# Patient Record
Sex: Male | Born: 1985 | Race: Black or African American | Hispanic: No | Marital: Single | Smoking: Former smoker
Health system: Southern US, Community
[De-identification: ages and names within clinical notes are randomized; demographics above are authoritative.]

## PROBLEM LIST (undated history)

## (undated) DIAGNOSIS — S4990XA Unspecified injury of shoulder and upper arm, unspecified arm, initial encounter: Secondary | ICD-10-CM

## (undated) DIAGNOSIS — A749 Chlamydial infection, unspecified: Secondary | ICD-10-CM

---

## 2009-03-21 ENCOUNTER — Emergency Department (HOSPITAL_COMMUNITY): Admission: EM | Admit: 2009-03-21 | Discharge: 2009-03-21 | Payer: Self-pay | Admitting: Emergency Medicine

## 2010-09-11 LAB — URINALYSIS, ROUTINE W REFLEX MICROSCOPIC
Hgb urine dipstick: NEGATIVE
Protein, ur: NEGATIVE mg/dL
Urobilinogen, UA: 1 mg/dL (ref 0.0–1.0)

## 2010-09-11 LAB — GC/CHLAMYDIA PROBE AMP, GENITAL: Chlamydia, DNA Probe: POSITIVE — AB

## 2010-09-11 LAB — URINE MICROSCOPIC-ADD ON

## 2010-09-11 LAB — RPR: RPR Ser Ql: NONREACTIVE

## 2011-07-28 ENCOUNTER — Emergency Department (HOSPITAL_COMMUNITY)
Admission: EM | Admit: 2011-07-28 | Discharge: 2011-07-28 | Disposition: A | Discharge status: Home Health | Payer: Self-pay | Attending: Emergency Medicine | Admitting: Emergency Medicine

## 2011-07-28 ENCOUNTER — Encounter (HOSPITAL_COMMUNITY): Payer: Self-pay

## 2011-07-28 DIAGNOSIS — R369 Urethral discharge, unspecified: Secondary | ICD-10-CM | POA: Insufficient documentation

## 2011-07-28 DIAGNOSIS — A64 Unspecified sexually transmitted disease: Secondary | ICD-10-CM | POA: Insufficient documentation

## 2011-07-28 DIAGNOSIS — L293 Anogenital pruritus, unspecified: Secondary | ICD-10-CM | POA: Insufficient documentation

## 2011-07-28 DIAGNOSIS — N509 Disorder of male genital organs, unspecified: Secondary | ICD-10-CM | POA: Insufficient documentation

## 2011-07-28 DIAGNOSIS — R300 Dysuria: Secondary | ICD-10-CM | POA: Insufficient documentation

## 2011-07-28 LAB — URINALYSIS, ROUTINE W REFLEX MICROSCOPIC
Hgb urine dipstick: NEGATIVE
Leukocytes, UA: NEGATIVE
Nitrite: NEGATIVE
Specific Gravity, Urine: >1.03 — ABNORMAL HIGH (ref 1.005–1.030)
Urobilinogen, UA: 0.2 mg/dL (ref 0.0–1.0)

## 2011-07-28 MED ORDER — DOXYCYCLINE HYCLATE 100 MG PO CAPS: 100.0000 mg | ORAL_CAPSULE | Freq: Two times a day (BID) | ORAL | Status: AC

## 2011-07-28 MED ORDER — AZITHROMYCIN 250 MG PO TABS
1000.0000 mg | ORAL_TABLET | Freq: Once | ORAL | Status: AC
  Administered 2011-07-28: 1000 mg via ORAL
  Filled 2011-07-28: qty 4

## 2011-07-28 MED ORDER — CEFTRIAXONE SODIUM 250 MG IJ SOLR
250.0000 mg | Freq: Once | INTRAMUSCULAR | Status: AC
  Administered 2011-07-28: 250 mg via INTRAMUSCULAR
  Filled 2011-07-28: qty 250

## 2011-07-28 NOTE — ED Provider Notes (Signed)
History     CSN: [id number]  Arrival date & time 07/28/11  1117   First MD Initiated Contact with Patient 07/28/11 1231      Chief Complaint  Patient presents with  . Penile Discharge    (Consider location/radiation/quality/duration/timing/severity/associated sxs/prior treatment) Patient is a 26 y.o. male presenting with penile discharge. The history is provided by the patient. No language interpreter was used.  Penile Discharge This is a new problem. The current episode started in the past 7 days. The problem occurs 2 to 4 times per day. The problem has been gradually worsening. Associated symptoms include urinary symptoms. Pertinent negatives include no abdominal pain, fever, nausea, rash, sore throat, swollen glands or vomiting. Exacerbated by: urinating.   Reports penile discharge and burning with urination which itch x7 days. States that he had testicular pain a month ago but that has resolved. States that he was exposed to chlamydia. Patient has more than one partner.  History reviewed. No pertinent past medical history.  History reviewed. No pertinent past surgical history.  No family history on file.  History  Substance Use Topics  . Smoking status: Never Smoker   . Smokeless tobacco: Not on file  . Alcohol Use: No      Review of Systems  Constitutional: Negative for fever.  HENT: Negative for sore throat.   Gastrointestinal: Negative for nausea, vomiting and abdominal pain.  Genitourinary: Positive for dysuria and discharge. Negative for urgency, frequency, hematuria, flank pain, decreased urine volume, penile swelling, scrotal swelling, difficulty urinating, genital sores, penile pain and testicular pain.  Musculoskeletal: Negative for back pain.  Skin: Negative for rash.  All other systems reviewed and are negative.    Allergies  Review of patient's allergies indicates no known allergies.  Home Medications   Current Outpatient Rx  Name Route Sig Dispense  Refill  . DOXYCYCLINE HYCLATE 100 MG PO CAPS Oral Take 1 capsule (100 mg total) by mouth 2 (two) times daily. 14 capsule 0    BP 130/86  Pulse 64  Temp(Src) 98.1 F (36.7 C) (Oral)  Resp 12  Ht 5\' 11"  (1.803 m)  Wt 170 lb (77.111 kg)  BMI 23.71 kg/m2  SpO2 99%  Physical Exam  Nursing note and vitals reviewed. Constitutional: He is oriented to person, place, and time. He appears well-developed and well-nourished.  HENT:  Head: Normocephalic and atraumatic.  Eyes: Pupils are equal, round, and reactive to light.  Neck: Neck supple.  Cardiovascular: Normal rate and regular rhythm.  Exam reveals no gallop and no friction rub.   No murmur heard. Pulmonary/Chest: Breath sounds normal. No respiratory distress.  Abdominal: Soft. He exhibits no distension. Hernia confirmed negative in the right inguinal area and confirmed negative in the left inguinal area.  Genitourinary: Right testis shows no mass, no swelling and no tenderness. Left testis shows no mass, no swelling and no tenderness. No penile erythema or penile tenderness. No discharge found.  Musculoskeletal: Normal range of motion.  Neurological: He is alert and oriented to person, place, and time. No cranial nerve deficit.  Skin: Skin is warm and dry.  Psychiatric: He has a normal mood and affect.    ED Course  Procedures (including critical care time)   Labs Reviewed  URINALYSIS, ROUTINE W REFLEX MICROSCOPIC  WET PREP, GENITAL   No results found.   1. STD (male)       MDM  Penile drainage x 7 days.  Unprotected sex with 2 partners. Rocephen 250mg  IM and 1  gm azithromycin po in the ER. RX for doxycycline.   No Discharge on exam. Will followup in 7 days [facility]        Jethro Bastos, NP 07/29/11 1718

## 2011-07-28 NOTE — ED Notes (Signed)
Penile drainage and itchy for 7 days, also having dysuria

## 2011-07-28 NOTE — Discharge Instructions (Signed)
Mr Siemon followup at the [facility] located on [facility] .[phone]

## 2011-07-29 LAB — GC/CHLAMYDIA PROBE AMP, GENITAL
Chlamydia, DNA Probe: NEGATIVE
GC Probe Amp, Genital: NEGATIVE

## 2011-07-31 NOTE — ED Provider Notes (Signed)
Medical screening examination/treatment/procedure(s) were performed by non-physician practitioner and as supervising physician I was immediately available for consultation/collaboration.  Doug Sou, MD 07/31/11 [phone]

## 2012-07-16 ENCOUNTER — Encounter (HOSPITAL_COMMUNITY): Payer: Self-pay | Admitting: Emergency Medicine

## 2012-07-16 ENCOUNTER — Emergency Department (HOSPITAL_COMMUNITY)
Admission: EM | Admit: 2012-07-16 | Discharge: 2012-07-16 | Disposition: A | Discharge status: Home / Self Care | Payer: Self-pay | Attending: Emergency Medicine | Admitting: Emergency Medicine

## 2012-07-16 DIAGNOSIS — Z8619 Personal history of other infectious and parasitic diseases: Secondary | ICD-10-CM | POA: Insufficient documentation

## 2012-07-16 DIAGNOSIS — Z7251 High risk heterosexual behavior: Secondary | ICD-10-CM | POA: Insufficient documentation

## 2012-07-16 DIAGNOSIS — N342 Other urethritis: Secondary | ICD-10-CM | POA: Insufficient documentation

## 2012-07-16 HISTORY — DX: Chlamydial infection, unspecified: A74.9

## 2012-07-16 LAB — URINALYSIS, ROUTINE W REFLEX MICROSCOPIC
Bilirubin Urine: NEGATIVE
Glucose, UA: NEGATIVE mg/dL
Hgb urine dipstick: NEGATIVE
Ketones, ur: NEGATIVE mg/dL
Protein, ur: NEGATIVE mg/dL

## 2012-07-16 MED ORDER — CEFTRIAXONE SODIUM 250 MG IJ SOLR
250.0000 mg | INTRAMUSCULAR | Status: DC
  Administered 2012-07-16: 250 mg via INTRAMUSCULAR
  Filled 2012-07-16: qty 250

## 2012-07-16 MED ORDER — AZITHROMYCIN 1 G PO PACK
1.0000 g | PACK | Freq: Once | ORAL | Status: AC
  Administered 2012-07-16: 1 g via ORAL
  Filled 2012-07-16: qty 1

## 2012-07-16 NOTE — ED Notes (Signed)
Pt c/o itching and discomfort around penis area x 1 week. Pt denies discharge.

## 2012-07-16 NOTE — ED Provider Notes (Signed)
History  This chart was scribed for non-physician practitioner, Wynetta Emery, PA-C working with Loren Racer, MD by Shari Heritage, ED Scribe. This patient was seen in room TR09C/TR09C and the patient's care was started at 1646.   CSN: [id number]  Arrival date & time 07/16/12  1522   First MD Initiated Contact with Patient 07/16/12 1646      Chief Complaint  Patient presents with  . Groin Pain    The history is provided by the patient. No language interpreter was used.    HPI Comments: Kenneth Tanner is a 27 y.o. male who presents to the Emergency Department complaining of mild, intermittent, non-radiating penile pain with associated itching and dysuria onset 1 week ago. Patient denies penile discharge, rash, abdominal pain or fever. He states that he has had unprotected sex recently. He denies any other complaints at this time. He has a history of chlamydia. Patient denies any allergies to medicine.  Past Medical History  Diagnosis Date  . Chlamydia     History reviewed. No pertinent past surgical history.  No family history on file.  History  Substance Use Topics  . Smoking status: Never Smoker   . Smokeless tobacco: Not on file  . Alcohol Use: No      Review of Systems  Constitutional: Negative for fever.  Gastrointestinal: Negative for nausea, vomiting, abdominal pain and diarrhea.  Genitourinary: Positive for dysuria and penile pain. Negative for discharge.  All other systems reviewed and are negative.    Allergies  Review of patient's allergies indicates no known allergies.  Home Medications  No current outpatient prescriptions on file.  Triage Vitals: BP 135/74  Pulse 72  Temp(Src) 98.2 F (36.8 C) (Oral)  Resp 20  SpO2 99%  Physical Exam  Nursing note and vitals reviewed. Constitutional: He is oriented to person, place, and time. He appears well-developed and well-nourished. No distress.  HENT:  Head: Normocephalic.  Eyes: Conjunctivae and  EOM are normal. Pupils are equal, round, and reactive to light.  Cardiovascular: Normal rate.   Pulmonary/Chest: Effort normal and breath sounds normal. No stridor.  Abdominal: Bowel sounds are normal.  Genitourinary: Penis normal. Right testis shows no mass, no swelling and no tenderness. Right testis is descended. Left testis shows no mass, no swelling and no tenderness. Left testis is descended. No penile tenderness. No discharge found.  GU exam chaperoned by technician  Musculoskeletal: Normal range of motion.  Neurological: He is alert and oriented to person, place, and time.  Psychiatric: He has a normal mood and affect.    ED Course  Procedures (including critical care time) DIAGNOSTIC STUDIES: Oxygen Saturation is 99% on room air, normal by my interpretation.    COORDINATION OF CARE: 6:09 PM- Patient informed of current plan for treatment and evaluation and agrees with plan at this time.    Labs Reviewed  GC/CHLAMYDIA PROBE AMP  URINALYSIS, ROUTINE W REFLEX MICROSCOPIC   Medications  azithromycin (ZITHROMAX) powder 1 g (1 g Oral Given 07/16/12 1921)    No results found.   1. Urethritis       MDM   Patient with urethritis GU exam is normal. I have advised prophylactic treatment today before GC Chlamydia probe is finalized. Patient understands he has not been diagnosed with GC Chlamydia and has opted for treatment today. I also advised a full STD screen. Patient is advised to refrain from sex until he has the results of all STD tests.   Filed Vitals:   07/16/12 1547  BP: 135/74  Pulse: 72  Temp: 98.2 F (36.8 C)  TempSrc: Oral  Resp: 20  SpO2: 99%     Pt verbalized understanding and agrees with care plan. Outpatient follow-up and return precautions given.    I personally performed the services described in this documentation, which was scribed in my presence. The recorded information has been reviewed and is accurate.    Wynetta Emery, PA-C 07/17/12  1710

## 2012-07-18 NOTE — ED Provider Notes (Signed)
Medical screening examination/treatment/procedure(s) were performed by non-physician practitioner and as supervising physician I was immediately available for consultation/collaboration.   Loren Racer, MD 07/18/12 1538

## 2013-08-04 ENCOUNTER — Emergency Department (HOSPITAL_COMMUNITY)
Admission: EM | Admit: 2013-08-04 | Discharge: 2013-08-04 | Disposition: A | Discharge status: Home / Self Care | Payer: Self-pay | Attending: Emergency Medicine | Admitting: Emergency Medicine

## 2013-08-04 ENCOUNTER — Emergency Department (HOSPITAL_COMMUNITY): Payer: Self-pay

## 2013-08-04 ENCOUNTER — Encounter (HOSPITAL_COMMUNITY): Payer: Self-pay | Admitting: Emergency Medicine

## 2013-08-04 DIAGNOSIS — S43401A Unspecified sprain of right shoulder joint, initial encounter: Secondary | ICD-10-CM

## 2013-08-04 DIAGNOSIS — Y9323 Activity, snow (alpine) (downhill) skiing, snow boarding, sledding, tobogganing and snow tubing: Secondary | ICD-10-CM | POA: Insufficient documentation

## 2013-08-04 DIAGNOSIS — Y929 Unspecified place or not applicable: Secondary | ICD-10-CM | POA: Insufficient documentation

## 2013-08-04 DIAGNOSIS — Z8619 Personal history of other infectious and parasitic diseases: Secondary | ICD-10-CM | POA: Insufficient documentation

## 2013-08-04 DIAGNOSIS — IMO0002 Reserved for concepts with insufficient information to code with codable children: Secondary | ICD-10-CM | POA: Insufficient documentation

## 2013-08-04 MED ORDER — HYDROCODONE-ACETAMINOPHEN 5-325 MG PO TABS
1.0000 | ORAL_TABLET | Freq: Once | ORAL | Status: AC
  Administered 2013-08-04: 1 via ORAL
  Filled 2013-08-04: qty 1

## 2013-08-04 MED ORDER — HYDROCODONE-ACETAMINOPHEN 5-325 MG PO TABS: 1.0000 | ORAL_TABLET | ORAL | Status: DC | PRN

## 2013-08-04 MED ORDER — IBUPROFEN 800 MG PO TABS: 800.0000 mg | ORAL_TABLET | Freq: Three times a day (TID) | ORAL | Status: DC

## 2013-08-04 NOTE — ED Provider Notes (Signed)
CSN: [id number]     Arrival date & time 08/04/13  0058 History   First MD Initiated Contact with Patient 08/04/13 0119     Chief Complaint  Patient presents with  . Shoulder Pain     (Consider location/radiation/quality/duration/timing/severity/associated sxs/prior Treatment) HPI History provided by pt.   Pt was snowboarding for the first time today, fell off and landed on back on the snow.  Did not hit his head and denies neck/back pain.  C/o pain in right shoulder that is non-radiating and aggravated by ROM and palpation only.  No associated paresthesias or weakness of RUE. Has not taken anything for pain. No prior injury to this shoulder.  Past Medical History  Diagnosis Date  . Chlamydia    History reviewed. No pertinent past surgical history. History reviewed. No pertinent family history. History  Substance Use Topics  . Smoking status: Never Smoker   . Smokeless tobacco: Not on file  . Alcohol Use: No    Review of Systems  All other systems reviewed and are negative.      Allergies  Review of patient's allergies indicates no known allergies.  Home Medications   Current Outpatient Rx  Name  Route  Sig  Dispense  Refill  . HYDROcodone-acetaminophen (NORCO/VICODIN) 5-325 MG per tablet   Oral   Take 1 tablet by mouth every 4 (four) hours as needed for moderate pain.   15 tablet   0   . ibuprofen (ADVIL,MOTRIN) 800 MG tablet   Oral   Take 1 tablet (800 mg total) by mouth 3 (three) times daily.   12 tablet   0    BP 125/75  Pulse 78  Temp(Src) 98 F (36.7 C) (Oral)  Resp 17  SpO2 97% Physical Exam  Nursing note and vitals reviewed. Constitutional: He is oriented to person, place, and time. He appears well-developed and well-nourished. No distress.  HENT:  Head: Normocephalic and atraumatic.  Eyes:  Normal appearance  Neck: Normal range of motion.  Pulmonary/Chest: Effort normal.  Musculoskeletal: Normal range of motion.  Entire spine non-tender.  R  shoulder w/out deformity, ecchymosis or abrasion.  Tenderness isolated to just inferior to lateral scapular spine.  Pain w/ passive flexion and abduction >45 deg.  Nml elbow and wrist.  2+ radial pulse and distal sensation intact.      Neurological: He is alert and oriented to person, place, and time.  Psychiatric: He has a normal mood and affect. His behavior is normal.    ED Course  Procedures (including critical care time) Labs Review Labs Reviewed - No data to display Imaging Review Dg Shoulder Right  08/04/2013   CLINICAL DATA:  Larey SeatFell off snowboard; landed on right shoulder, with anterior right shoulder pain.  EXAM: RIGHT SHOULDER - 2+ VIEW  COMPARISON:  None.  FINDINGS: There is no evidence of fracture or dislocation. The right humeral head is seated within the glenoid fossa. The acromioclavicular joint is unremarkable in appearance. No significant soft tissue abnormalities are seen. The visualized portions of the right lung are clear.  IMPRESSION: No evidence of fracture or dislocation.   Electronically Signed   By: Roanna RaiderJeffery  Chang M.D.   On: 08/04/2013 01:56    EKG Interpretation   None       MDM   Final diagnoses:  Sprain of right shoulder    Healthy 27yo M presents w/ R shoulder injury.  Xray neg for fx/dislocation.  No NV deficits of RUE on exam.  Nursing staff placed  in shoulder sling and pt d/c'd home w/ vicodin and 800mg  ibuprofen.  Recommended rest, ice and f/u w/ ortho for persistent sx.      Otilio Miu, PA-C 08/04/13 [phone]

## 2013-08-04 NOTE — ED Notes (Signed)
Pt states he was snow boarding and fell landing on his right shoulder  Pt has been having pain since  CMS checks to right hand wnl

## 2013-08-04 NOTE — ED Provider Notes (Signed)
Medical screening examination/treatment/procedure(s) were performed by non-physician practitioner and as supervising physician I was immediately available for consultation/collaboration.   Zalmen Wrightsman M Riely Baskett, MD 08/04/13 0711 

## 2013-08-04 NOTE — Discharge Instructions (Signed)
Take vicodin as prescribed for severe pain.   Do not drive within four hours of taking this medication (may cause drowsiness or confusion).  Take up to 800mg of ibuprofen three times a day for the next 3-4 days (take with food).  Ice 3 times a day for 15-20 minutes.  Activity as tolerated.  Take arm out of sling at least twice a day and do gentle range of motion exercises, in order to prevent joint stiffness.  Follow up with the orthopedist if your pain has not started to improve in 5-7 days, or you develop weakness of the injured joint.    You may return to the ER if your pain worsens or you have any other concerns.   °

## 2013-10-07 ENCOUNTER — Encounter (HOSPITAL_COMMUNITY): Payer: Self-pay | Admitting: Emergency Medicine

## 2013-10-07 ENCOUNTER — Emergency Department (HOSPITAL_COMMUNITY)
Admission: EM | Admit: 2013-10-07 | Discharge: 2013-10-07 | Disposition: A | Discharge status: Home / Self Care | Payer: Self-pay | Attending: Emergency Medicine | Admitting: Emergency Medicine

## 2013-10-07 ENCOUNTER — Emergency Department (HOSPITAL_COMMUNITY): Payer: Self-pay

## 2013-10-07 DIAGNOSIS — Z791 Long term (current) use of non-steroidal anti-inflammatories (NSAID): Secondary | ICD-10-CM | POA: Insufficient documentation

## 2013-10-07 DIAGNOSIS — B009 Herpesviral infection, unspecified: Secondary | ICD-10-CM | POA: Insufficient documentation

## 2013-10-07 DIAGNOSIS — Z202 Contact with and (suspected) exposure to infections with a predominantly sexual mode of transmission: Secondary | ICD-10-CM

## 2013-10-07 LAB — URINALYSIS, ROUTINE W REFLEX MICROSCOPIC
BILIRUBIN URINE: NEGATIVE
Glucose, UA: NEGATIVE mg/dL
HGB URINE DIPSTICK: NEGATIVE
Ketones, ur: NEGATIVE mg/dL
Leukocytes, UA: NEGATIVE
Nitrite: NEGATIVE
PH: 6.5 (ref 5.0–8.0)
Protein, ur: NEGATIVE mg/dL
SPECIFIC GRAVITY, URINE: 1.022 (ref 1.005–1.030)
Urobilinogen, UA: 0.2 mg/dL (ref 0.0–1.0)

## 2013-10-07 LAB — HIV ANTIBODY (ROUTINE TESTING W REFLEX): HIV: NONREACTIVE

## 2013-10-07 LAB — RPR

## 2013-10-07 MED ORDER — ACYCLOVIR 400 MG PO TABS: 400.0000 mg | ORAL_TABLET | Freq: Three times a day (TID) | ORAL | Status: DC

## 2013-10-07 MED ORDER — CEFTRIAXONE SODIUM 250 MG IJ SOLR
250.0000 mg | Freq: Once | INTRAMUSCULAR | Status: AC
  Administered 2013-10-07: 250 mg via INTRAMUSCULAR
  Filled 2013-10-07: qty 250

## 2013-10-07 MED ORDER — AZITHROMYCIN 250 MG PO TABS
1000.0000 mg | ORAL_TABLET | Freq: Once | ORAL | Status: AC
  Administered 2013-10-07: 1000 mg via ORAL
  Filled 2013-10-07: qty 4

## 2013-10-07 NOTE — ED Provider Notes (Signed)
CSN: [id number]     Arrival date & time 10/07/13  1428 History  This chart was scribed for non-physician practitioner, Raymon MuttonMarissa Keyaan Lederman, PA-C working with Hurman HornJohn M Bednar, MD by Greggory StallionKayla Andersen, ED scribe. This patient was seen in room WTR9/WTR9 and the patient's care was started at 4:14 PM.   Chief Complaint  Patient presents with  . Rash  . Dysuria  . Pelvic Pain   The history is provided by the patient. No language interpreter was used.   HPI Comments: Kenneth RomeStephen Tanner is a 28 y.o. male who presents to the Emergency Department complaining of an itchy penile rash and dysuria that started 3 weeks ago. States the rash started before the dysuria and causes throbbing penile pain. He also has associated clear penile discharge that started one week ago and testicular pain. [facility] one [facility] and told it was probably an irritation from soap. He states the need blood and swab tests. Pt is sexually active and does not use protection. He has had two sexual partners in the last 6 months. Denies fever, chills, abdominal pain, nausea, emesis, diarrhea, black tarry stool, hematochezia, chest pain, SOB, difficulty breathing, other rash, sore throat, trouble swallowing, eye pain, eye drainage, blurred vision, scrotal swelling.   Past Medical History  Diagnosis Date  . Chlamydia    No past surgical history on file. No family history on file. History  Substance Use Topics  . Smoking status: Never Smoker   . Smokeless tobacco: Not on file  . Alcohol Use: No    Review of Systems  Constitutional: Negative for fever and chills.  HENT: Negative for sore throat and trouble swallowing.   Eyes: Negative for pain, discharge and visual disturbance.  Respiratory: Negative for shortness of breath.   Cardiovascular: Negative for chest pain.  Gastrointestinal: Negative for nausea, vomiting, abdominal pain, diarrhea and blood in stool.  Genitourinary: Positive for dysuria,  discharge, penile pain and testicular pain. Negative for scrotal swelling.  Skin: Positive for rash.  All other systems reviewed and are negative.  Allergies  Review of patient's allergies indicates no known allergies.  Home Medications   Prior to Admission medications   Medication Sig Start Date End Date Taking? Authorizing Provider  HYDROcodone-acetaminophen (NORCO/VICODIN) 5-325 MG per tablet Take 1 tablet by mouth every 4 (four) hours as needed for moderate pain. 08/04/13   Arie Sabinaatherine E Schinlever, PA-C  ibuprofen (ADVIL,MOTRIN) 800 MG tablet Take 1 tablet (800 mg total) by mouth 3 (three) times daily. 08/04/13   Arie Sabinaatherine E Schinlever, PA-C   BP 123/84  Pulse 65  Temp(Src) 98.2 F (36.8 C) (Oral)  Resp 16  SpO2 100%  Physical Exam  Nursing note and vitals reviewed. Constitutional: He is oriented to person, place, and time. He appears well-developed and well-nourished. No distress.  HENT:  Head: Normocephalic and atraumatic.  Mouth/Throat: Oropharynx is clear and moist. No oropharyngeal exudate.  Negative swelling, erythema, inflammation, lesions, sores, petechiae, blisters noted to the buccal mucosa and posterior oropharynx. Negative trismus.  Eyes: Conjunctivae and EOM are normal. Pupils are equal, round, and reactive to light. Right eye exhibits no discharge. Left eye exhibits no discharge.  Neck: Normal range of motion. Neck supple. No tracheal deviation present.  Negative neck stiffness Negative nuchal rigidity Negative cervical lymphadenopathy Negative meningeal signs  Cardiovascular: Normal rate, regular rhythm and normal heart sounds.  Exam reveals no friction rub.   No murmur heard. Pulses:  Radial pulses are 2+ on the right side, and 2+ on the left side.       Dorsalis pedis pulses are 2+ on the right side, and 2+ on the left side.  Pulmonary/Chest: Effort normal. No respiratory distress.  Genitourinary:  Pelvic exam: Circumcised. Small vesicular rash with no  fluid collection identified to the tip of the penis an underlying the glans penis. Negative active drainage or bleeding noted. Negative chancre. Negative active drainage or bleeding noted from the urethra of the penis. Negative pain upon palpation to the penis. Negative swelling, erythema, formation, lesions, sores identified to the scrotum. Negative pain upon palpation to the testicles bilaterally. Negative inguinal lymphadenopathy noted. Mild discomfort upon palpation to the left inguinal region. Negative suprapubic pain. Exam chaperoned with tech  Musculoskeletal: Normal range of motion.  Full ROM to upper and lower extremities without difficulty noted, negative ataxia noted.  Lymphadenopathy:    He has no cervical adenopathy.  Neurological: He is alert and oriented to person, place, and time. No cranial nerve deficit. He exhibits normal muscle tone. Coordination normal.  Cranial nerves III-XII grossly intact Strength 5+/5+ to upper and lower extremities bilaterally with resistance applied, equal distribution noted Equal grip strength bilaterally  Skin: Skin is warm and dry. He is not diaphoretic.  Negative rash to the palms the hands or soles of the feet bilaterally  Psychiatric: He has a normal mood and affect. His behavior is normal. Thought content normal.    ED Course  Procedures (including critical care time)  DIAGNOSTIC STUDIES: Oxygen Saturation is 99% on RA, normal by my interpretation.    COORDINATION OF CARE: 4:20 PM-Discussed treatment plan which includes STD testing and UA with pt at bedside and pt agreed to plan.   6:40 PM This provider spoke with Dr. Fonnie JarvisBednar - Dr. Fonnie JarvisBednar to see and assess the patient.   Results for orders placed during the hospital encounter of 10/07/13  URINALYSIS, ROUTINE W REFLEX MICROSCOPIC      Result Value Ref Range   Color, Urine YELLOW  YELLOW   APPearance CLOUDY (*) CLEAR   Specific Gravity, Urine 1.022  1.005 - 1.030   pH 6.5  5.0 - 8.0    Glucose, UA NEGATIVE  NEGATIVE mg/dL   Hgb urine dipstick NEGATIVE  NEGATIVE   Bilirubin Urine NEGATIVE  NEGATIVE   Ketones, ur NEGATIVE  NEGATIVE mg/dL   Protein, ur NEGATIVE  NEGATIVE mg/dL   Urobilinogen, UA 0.2  0.0 - 1.0 mg/dL   Nitrite NEGATIVE  NEGATIVE   Leukocytes, UA NEGATIVE  NEGATIVE   Labs Review Labs Reviewed  URINALYSIS, ROUTINE W REFLEX MICROSCOPIC - Abnormal; Notable for the following:    APPearance CLOUDY (*)    All other components within normal limits  GC/CHLAMYDIA PROBE AMP  HIV ANTIBODY (ROUTINE TESTING)  RPR  HSV 1 ANTIBODY, IGG  HSV 2 ANTIBODY, IGG    Imaging Review [place] Scrotum  10/07/2013   CLINICAL DATA:  Scrotal pain  EXAM: SCROTAL ULTRASOUND  DOPPLER ULTRASOUND OF THE TESTICLES  TECHNIQUE: Complete ultrasound examination of the testicles, epididymis, and other scrotal structures was performed. Color and spectral Doppler ultrasound were also utilized to evaluate blood flow to the testicles.  COMPARISON:  None.  FINDINGS: Right testicle  Measurements: 5.1 x 2.6 x 2.6 cm. No mass or microlithiasis visualized.  Left testicle  Measurements: 5.1 x 2.7 x 3.1 cm. No mass or microlithiasis visualized.  Right epididymis:  Normal in size and appearance.  Left epididymis:  Normal in size and appearance.  Hydrocele:  Small bilateral hydroceles.  Varicocele:  None visualized.  Pulsed Doppler interrogation of both testes demonstrates low resistance arterial and venous waveforms bilaterally.  IMPRESSION: Normal sonographic appearance of the bilateral testes.  Small bilateral hydroceles.  No evidence of testicular torsion.   Electronically Signed   By: Charline Bills M.D.   On: 10/07/2013 17:51   [place] Art/ven Flow Abd Pelv Doppler  10/07/2013   CLINICAL DATA:  Scrotal pain  EXAM: SCROTAL ULTRASOUND  DOPPLER ULTRASOUND OF THE TESTICLES  TECHNIQUE: Complete ultrasound examination of the testicles, epididymis, and other scrotal structures was performed. Color and spectral Doppler  ultrasound were also utilized to evaluate blood flow to the testicles.  COMPARISON:  None.  FINDINGS: Right testicle  Measurements: 5.1 x 2.6 x 2.6 cm. No mass or microlithiasis visualized.  Left testicle  Measurements: 5.1 x 2.7 x 3.1 cm. No mass or microlithiasis visualized.  Right epididymis:  Normal in size and appearance.  Left epididymis:  Normal in size and appearance.  Hydrocele:  Small bilateral hydroceles.  Varicocele:  None visualized.  Pulsed Doppler interrogation of both testes demonstrates low resistance arterial and venous waveforms bilaterally.  IMPRESSION: Normal sonographic appearance of the bilateral testes.  Small bilateral hydroceles.  No evidence of testicular torsion.   Electronically Signed   By: Charline Bills M.D.   On: 10/07/2013 17:51     EKG Interpretation None      MDM   Final diagnoses:  Herpes  Exposure to STD   Medications  cefTRIAXone (ROCEPHIN) injection 250 mg (250 mg Intramuscular Given 10/07/13 1833)  azithromycin (ZITHROMAX) tablet 1,000 mg (1,000 mg Oral Given 10/07/13 1834)   Filed Vitals:   10/07/13 1448 10/07/13 1816  BP: 126/67 123/84  Pulse: 65 65  Temp: 98.2 F (36.8 C)   TempSrc: Oral   Resp: 14 16  SpO2: 99% 100%   I personally performed the services described in this documentation, which was scribed in my presence. The recorded information has been reviewed and is accurate.  Patient presenting to the ED with a rash localized to his penis that started approximately one week ago. Patient states that he is sexually active without using protection, reported at least 2 partners within the last 6 months.  Negative active drainage from the rash. Does not appear to be pustular in nature, appears to be a vesicular rash without fluid. Negative pain upon palpation. Negative chancre.  Urinalysis negative for hemoglobin. Negative nitrites or leukocytes identified. RPR, HIV, GC Chlamydia probe pending. Scrotal ultrasound negative for acute  abnormalities-negative findings of epididymitis. Small bilateral hydroceles identified. Negative findings of testicular torsion.  negative findings of epididymitis. Negative findings of testicular torsion. Patient was treated prophylactically for STDs. Labs pending. Suspicion to be possible herpes. Patient seen and assessed by attending physician - agreed with herpes and for patient to be treated. Patient stable, afebrile. Discharged patient. Discharged patient with cyclic beer. Discussed with patient to rest and stay hydrated. Discussed with patient to avoid any sexual activity. Educated patient on herpes. [facility] Discussed with patient to closely monitor symptoms and if symptoms are to worsen or change to report back to the ED - strict return instructions given.  Patient agreed to plan of care, understood, all questions answered.   Raymon Mutton, PA-C 10/07/13 2111  Raymon Mutton, PA-C 10/07/13 2111

## 2013-10-07 NOTE — ED Notes (Signed)
Pt c/o penile rash w/ dysuria and pelvic pain x 3 wks.  [facility] for this and was given antifungal cream.  Has not gotten any better.

## 2013-10-07 NOTE — Discharge Instructions (Signed)
Please call your doctor for a followup appointment within 24-48 hours. When you talk to your doctor please let them know that you were seen in the emergency department and have them acquire all of your records so that they can discuss the findings with you and formulate a treatment plan to fully care for your new and ongoing problems. Please call and set-up an appointment with Health Department for further testing to be performed Please rest and stay hydrated Please avoid any sexual activity because infection is contagious  While having sex there should always be a barrier protection - condoms, etc - to prevent spreading of STDs Please take medications as prescribed Please continue to monitor symptoms closely and if symptoms are to worsen or change (fever greater than 101, chills, sweating, nausea, vomiting, diarrhea, abdominal pain, swelling to the testicles, numbness, tingling, discharge, swelling to the penis, rash changes, rash to the palms of the hands or soles of the feet, chest pain, shortness of breath, difficulty breathing, difficulty swallowing, swelling to the throat) please report back to the ED immediately   Herpes Simplex Herpes simplex is generally classified as Type 1 or Type 2. Type 1 is generally the type that is responsible for cold sores. Type 2 is generally associated with sexually transmitted diseases. We now know that most of the thoughts on these viruses are inaccurate. We find that HSV1 is also present genitally and HSV2 can be present orally, but this will vary in different locations of the world. Herpes simplex is usually detected by doing a culture. Blood tests are also available for this virus; however, the accuracy is often not as good.  PREPARATION FOR TEST No preparation or fasting is necessary. NORMAL FINDINGS  No virus present  No HSV antigens or antibodies present Ranges for normal findings may vary among different laboratories and hospitals. You should always  check with your doctor after having lab work or other tests done to discuss the meaning of your test results and whether your values are considered within normal limits. MEANING OF TEST  Your caregiver will go over the test results with you and discuss the importance and meaning of your results, as well as treatment options and the need for additional tests if necessary. OBTAINING THE TEST RESULTS  It is your responsibility to obtain your test results. Ask the lab or department performing the test when and how you will get your results. Document Released: 06/27/2004 Document Revised: 08/17/2011 Document Reviewed: 05/05/2008 [facility] Patient Information 2014 [place]Care, [place].   Emergency Department Resource Guide 1) Find a Doctor and Pay Out of Pocket Although you won't have to find out who is covered by your insurance plan, it is a good idea to ask around and get recommendations. You will then need to call the office and see if the doctor you have chosen will accept you as a new patient and what types of options they offer for patients who are self-pay. Some doctors offer discounts or will set up payment plans for their patients who do not have insurance, but you will need to ask so you aren't surprised when you get to your appointment.  2) Contact Your Local Health Department Not all health departments have doctors that can see patients for sick visits, but many do, so it is worth a call to see if yours does. If you don't know where your local health department is, you can check in your phone book. The CDC also has a tool to help you locate  your state's health department, and many state websites also have listings of all of their local health departments.  3) Find a Walk-in Clinic If your illness is not likely to be very severe or complicated, you may want to try a walk in clinic. These are popping up all over the country in pharmacies, drugstores, and shopping centers. They're usually staffed by  nurse practitioners or physician assistants that have been trained to treat common illnesses and complaints. They're usually fairly quick and inexpensive. However, if you have serious medical issues or chronic medical problems, these are probably not your best option.  No Primary Care Doctor: - Call Health Connect at  [phone] - they can help you locate a primary care doctor that  accepts your insurance, provides certain services, etc. - Physician Referral Service- [phone]  Chronic Pain Problems: Organization         Address  Phone   Notes  [facility]  [phone]) [phone] Patients need to be referred by their primary care doctor.   Medication Assistance: Organization         Address  Phone   Notes  [facility] Medication [facility] [address] [place], [place] [address] [phone]) [phone] --Must be a resident of [facility] [place] -- Must have NO insurance coverage whatsoever (no Medicaid/ Medicare, etc.) -- The pt. MUST have a primary care doctor that directs their care regularly and follows them in the community   MedAssist  [phone]   [employer]  [phone]    Agencies that provide inexpensive medical care: Organization         Address  Phone   Notes  [facility]  [phone]   [facility]    [phone]) [phone]   [facility] [address] [place], [place] [address] [phone]) [phone]   [facility] [address], [place] [phone]) [phone]   Planned Parenthood    [phone](336) [phone]   [facility]    [phone]) [phone]   [facility]  [address], [place] Phone:  [phone], Fax:  [phone] Hours of Operation:  9 am - 6 pm, M-F.  Also accepts Medicaid/Medicare and self-pay.  [facility]  [address], [place] Phone: [phone]) [phone], Fax: [phone]. Hours of Operation:  8:30  am - 5:30 pm, M-F.  Also accepts Medicaid and self-pay.  [facility] [place] [address], [place] Phone: [phone]) [phone]   [facility] [address], [place], [place] [phone], Ext. [phone] Mondays & Thursdays: 7-9 AM.  First 15 patients are seen on a first come, first serve basis.    Medicaid-accepting [facility] Providers:  Organization         Address  Phone   Notes  [facility] [address], [place] [phone]) [phone] Also accepts self-pay patients.  [facility] [address], [place]  [phone]) [phone]   [facility] [address], [place] [phone]   [facility] [address] [place] [address], [place] [phone]) [phone]   Renaye RakersVeita Bland [address], [place]   [phone] Only accepts [place] Access [place] patients after they have their name applied to their card.   Self-Pay (no insurance) in [facility] [place]:  Organization  Address  Phone   Notes  Sickle Cell Patients, [facility] [address] [place], [place] [phone]   [facility] [address] [place], [place] [phone]   [facility]  [address], [place] [phone]   [facility]/Dr. Osei-Bonsu  [address], [place] or [address], [place] [phone] Phone number for both [place] and [place] locations is the same.  Urgent Medical and [facility] [address] [place] [phone]   [facility] [address] [place] or [address] [phone]   [facility] [address], [place] [phone], phone; [phone], fax Sees patients 1st and 3rd Saturday of every month.  Must not qualify for public or private insurance (i.e. Medicaid, Medicare, [facility]  Choice, Veterans' Benefits)  Household income should be no more than 200% of the poverty level The clinic cannot treat you if you are pregnant or think you are pregnant  Sexually transmitted diseases are not treated at the clinic.    Dental Care: Organization         Address  Phone  Notes  [facility] Department of [facility] [address] [place], [place] [phone] Accepts children up to age 70 who are enrolled in [place] or [place] Health Choice; pregnant women with a Medicaid card; and children who have applied for Medicaid or [place] Health Choice, but were declined, whose parents can pay a reduced fee at time of service.  [facility]  [address], [place] [phone] Accepts children up to age 47 who are enrolled in [place] or [place] Health Choice; pregnant women with a Medicaid card; and children who have applied for Medicaid or  Health Choice, but were declined, whose parents can pay a reduced fee at time of service.  [facility]  [address] [place], [place] [phone] Patients are seen by appointment only. Walk-ins are not accepted. [facility] will see patients 85 years of age and older. Monday - Tuesday (8am-5pm) Most Wednesdays (8:30-5pm) $30 per visit, cash only  [facility] Adult [facility] Access PROGRAM  [address], [facility] [phone] Patients are seen by appointment only. Walk-ins are not accepted. [facility] will see patients 20 years of age and older. One Wednesday Evening (Monthly: Volunteer Based).  $30 per visit, cash only  [employer]  [phone] for adults; Children under age 1, call Graduate Pediatric Dentistry at [phone]. Children aged 23-14, please call [phone] to request a pediatric application.  Dental services are provided in all areas of dental care including fillings, crowns and bridges, complete  and partial dentures, implants, gum treatment, root canals, and extractions. Preventive care is also provided. Treatment is provided to both adults and children. Patients are selected via a lottery and there is often a waiting list.   [facility] [address], [place]  [phone] [email or web address]   [facility] [address] [place], [place] [phone], Ext. [phone] Second and Fourth Thursday of each month, opens at 6:30 AM; Clinic ends at 9 AM.  Patients are seen on a first-come first-served basis, and a limited number are seen during each clinic.   [facility]  [address] [place], [place]  [place], [place] [phone]   Eligibility Requirements You must have lived in [place], [place], or [place] counties for at least the last three months.   You cannot be eligible for state or federal sponsored [employer], including [employer], [place], or [employer].   You generally cannot be eligible for healthcare insurance through your employer.    How to apply: Eligibility screenings are held every Tuesday and Wednesday afternoon from 1:00 pm until 4:00 pm. You do not need an appointment for the interview!  [facility] [address] [place], [place]   [place]  [place] Department  [place]  [phone]    Behavioral Health Resources in the Community: Intensive Outpatient Programs Organization         Address  Phone  Notes  [place]age [place]. [address], [place], [place] [phone]   [facility] Outpatient [address] [place], [place]   ADS: [facility] Drug S[facility] [address], [place], [place]   [place] [address]  [place], [place] or [phone]   Substance Abuse Resources Organization          Address  Phone  Notes  Alcohol and Drug Services  [phone]   [place]  [phone]   [facility] [place]   Chinita Pester  [phone]   Residential & Outpatient Substance Abuse Program  [phone]   Psychological Services Organization         Address  Phone  Notes  [facility] [place]  [place]  [phone]   [place] [address] [place] or [phone]    Mobile Crisis Teams Organization         Address  Phone  Notes  [facility], Mobile Crisis Care Unit  [phone]   [facility]  [address] [place], [place]   [place] Levels [address] [place] [phone]    Self-Help/Support Groups Organization         Address  Phone             Notes  [place] of [place] - variety of support groups  [place] Call for more information  Narcotics Anonymous (NA), [facility] [address], [employer] [place]  2 meetings at this location   Special educational needs teacher         Address  Phone  Notes  [facility]idential [facility] [place],    [place]  [phone]   [facility]  [address] [place] [id number], [place], [place]   [place] [address] [place], [place] [phone] Admissions: 8am-3pm M-F  Incentives Substance [place] [address]    [place], [place] [phone]   [facility] [address] Jadene Pierini [place], [place]   [facility] [address],  [place], [place]   [facility]ight Programs - Intensive Outpatient [address], [place], [place]   [facility] ([place].,  [place], [place] or [phone]   [facility] (RTS) [address] [place], [place] Accepts Medicaid  [facility] [address]  [place] [phone] Substance Abuse/Addiction Treatment   [facility] Resources Organization  Address  Phone  Notes  °[facility]  (888) [phone]   °Julie Brannon, PhD [address] [place], [place]   ([phone]) [phone] or ([phone]   °[facility]   [address] °[place], [place] [phone]   °[facility] [address], [place], [place] [phone]) [phone] Insurance/Medicaid/sponsorship through [facility]  °[facility] [address]                                    [place], [place] [phone] Therapy/tele-psych/case  °[facility] [address]  ° [place], [place] [phone]    °Dr. Arfeen  ([phone]   °[facility] [place]  [employer] [place] [facility] 1) [address], [place] °2) [address], [place] °3)  [address], [place] ([phone]) [phone] °([phone]) [phone] ° °([phone]) [phone]   °[place] Child Abuse Hotline (336) 342-1394 or (336) [phone] (After Hours)    ° ° ° °

## 2013-10-09 LAB — GC/CHLAMYDIA PROBE AMP
CT PROBE, AMP APTIMA: NEGATIVE
GC PROBE AMP APTIMA: NEGATIVE

## 2013-10-10 NOTE — ED Provider Notes (Signed)
12:56 PM This provider spoke with the patient - verified by name, DOB, and address. Patient reported that he is doing well. Reported that he has been taking his medications - reported that he started his course yesterday, he is on day 2 today. Discussed with patient results from his STD panel. 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 Discussed with patient to go to Health Department to get blood work drawn. Discussed with patient to avoid sexual activity. Discussed with patient to continue anti-virals. Educated patient on STDs - recommended partner(s) to be tested as well. Discussed with patient to closely monitor symptoms and if symptoms are to worsen or change to report back to the ED - strict return instructions given.  Patient agreed to plan of care, understood, all questions answered.   Raymon MuttonMarissa Karlton Maya, PA-C 10/10/13 1312

## 2013-10-11 NOTE — ED Provider Notes (Signed)
Medical screening examination/treatment/procedure(s) were conducted as a shared visit with non-physician practitioner(s) and myself.  I personally evaluated the patient during the encounter.  Scattered vessicles on penile shaft.   Kenneth HornJohn M Shourya Macpherson, MD 10/11/13 [phone]

## 2013-10-11 NOTE — ED Provider Notes (Signed)
Medical screening examination/treatment/procedure(s) were conducted as a shared visit with non-physician practitioner(s) and myself.  I personally evaluated the patient during the encounter.  Scattered vessicles on penile shaft.   Kamerin Axford M Mikena Masoner, MD 10/11/13 2205 

## 2014-04-13 ENCOUNTER — Emergency Department (HOSPITAL_COMMUNITY)
Admission: EM | Admit: 2014-04-13 | Discharge: 2014-04-14 | Disposition: A | Discharge status: Home / Self Care | Payer: Self-pay | Attending: Emergency Medicine | Admitting: Emergency Medicine

## 2014-04-13 DIAGNOSIS — R300 Dysuria: Secondary | ICD-10-CM | POA: Insufficient documentation

## 2014-04-13 DIAGNOSIS — Z791 Long term (current) use of non-steroidal anti-inflammatories (NSAID): Secondary | ICD-10-CM | POA: Insufficient documentation

## 2014-04-13 DIAGNOSIS — Z79899 Other long term (current) drug therapy: Secondary | ICD-10-CM | POA: Insufficient documentation

## 2014-04-13 DIAGNOSIS — N4889 Other specified disorders of penis: Secondary | ICD-10-CM | POA: Insufficient documentation

## 2014-04-13 DIAGNOSIS — Z202 Contact with and (suspected) exposure to infections with a predominantly sexual mode of transmission: Secondary | ICD-10-CM | POA: Insufficient documentation

## 2014-04-13 DIAGNOSIS — Z8619 Personal history of other infectious and parasitic diseases: Secondary | ICD-10-CM | POA: Insufficient documentation

## 2014-04-14 ENCOUNTER — Encounter (HOSPITAL_COMMUNITY): Payer: Self-pay | Admitting: Emergency Medicine

## 2014-04-14 LAB — URINALYSIS, ROUTINE W REFLEX MICROSCOPIC
Bilirubin Urine: NEGATIVE
Glucose, UA: NEGATIVE mg/dL
HGB URINE DIPSTICK: NEGATIVE
KETONES UR: NEGATIVE mg/dL
Leukocytes, UA: NEGATIVE
NITRITE: NEGATIVE
PH: 6.5 (ref 5.0–8.0)
Protein, ur: NEGATIVE mg/dL
SPECIFIC GRAVITY, URINE: 1.021 (ref 1.005–1.030)
Urobilinogen, UA: 0.2 mg/dL (ref 0.0–1.0)

## 2014-04-14 LAB — URINE MICROSCOPIC-ADD ON

## 2014-04-14 MED ORDER — CEFTRIAXONE SODIUM 250 MG IJ SOLR
250.0000 mg | Freq: Once | INTRAMUSCULAR | Status: AC
  Administered 2014-04-14: 250 mg via INTRAMUSCULAR
  Filled 2014-04-14: qty 250

## 2014-04-14 MED ORDER — AZITHROMYCIN 250 MG PO TABS
1000.0000 mg | ORAL_TABLET | Freq: Once | ORAL | Status: AC
Start: 2014-04-14 — End: 2014-04-14
  Administered 2014-04-14: 1000 mg via ORAL
  Filled 2014-04-14: qty 4

## 2014-04-14 NOTE — ED Notes (Signed)
Bed: WA06 Expected date:  Expected time:  Means of arrival:  Comments: 

## 2014-04-14 NOTE — Discharge Instructions (Signed)
Please follow up with your primary care physician in 1-2 days. If you do not have one please call the [facility] number listed above. Someone will call in 48-72 hours for a positive gonorrhea or chlamydia test. Please refrain from any sexual course the next few days. If he to receive a call regarding a positive test result please refrain from sexual activity for 10 days to allow antibiotics to take effect. If it is positive he must inform partners within the last 6 months. Please read all discharge instructions and return precautions.   Safe Sex Safe sex is about reducing the risk of giving or getting a sexually transmitted disease (STD). STDs are spread through sexual contact involving the genitals, mouth, or rectum. Some STDs can be cured and others cannot. Safe sex can also prevent unintended pregnancies.  WHAT ARE SOME SAFE SEX PRACTICES?  Limit your sexual activity to only one partner who is having sex with only you.  Talk to your partner about his or her past partners, past STDs, and drug use.  Use a condom every time you have sexual intercourse. This includes vaginal, oral, and anal sexual activity. Both females and males should wear condoms during oral sex. Only use latex or polyurethane condoms and water-based lubricants. Using petroleum-based lubricants or oils to lubricate a condom will weaken the condom and increase the chance that it will break. The condom should be in place from the beginning to the end of sexual activity. Wearing a condom reduces, but does not completely eliminate, your risk of getting or giving an STD. STDs can be spread by contact with infected body fluids and skin.  Get vaccinated for hepatitis B and HPV.  Avoid alcohol and recreational drugs, which can affect your judgment. You may forget to use a condom or participate in high-risk sex.  For females, avoid douching after sexual intercourse. Douching can spread an infection farther into the  reproductive tract.  Check your body for signs of sores, blisters, rashes, or unusual discharge. See your health care provider if you notice any of these signs.  Avoid sexual contact if you have symptoms of an infection or are being treated for an STD. If you or your partner has herpes, avoid sexual contact when blisters are present. Use condoms at all other times.  If you are at risk of being infected with HIV, it is recommended that you take a prescription medicine daily to prevent HIV infection. This is called pre-exposure prophylaxis (PrEP). You are considered at risk if:  You are a man who has sex with other men (MSM).  You are a heterosexual man or woman who is sexually active with more than one partner.  You take drugs by injection.  You are sexually active with a partner who has HIV.  Talk with your health care provider about whether you are at high risk of being infected with HIV. If you choose to begin PrEP, you should first be tested for HIV. You should then be tested every 3 months for as long as you are taking PrEP.  See your health care provider for regular screenings, exams, and tests for other STDs. Before having sex with a new partner, each of you should be screened for STDs and should talk about the results with each other. WHAT ARE THE BENEFITS OF SAFE SEX?   There is less chance of getting or giving an STD.  You can prevent unwanted or unintended pregnancies.  By discussing safe sex  concerns with your partner, you may increase feelings of intimacy, comfort, trust, and honesty between the two of you. Document Released: 07/02/2004 Document Revised: 10/09/2013 Document Reviewed: 11/16/2011 [facility] Patient Information 2015 [place]Care, [place]. This information is not intended to replace advice given to you by your health care provider. Make sure you discuss any questions you have with your health care provider.

## 2014-04-14 NOTE — ED Notes (Signed)
Pt arrived to the ED with a complaint of burning upon urination and the possiblity of exposure to an std.  Pt states the burning lingers after urination.  Pt denies discharge.

## 2014-04-14 NOTE — ED Provider Notes (Signed)
CSN: [id number]     Arrival date & time 04/13/14  2324 History   First MD Initiated Contact with Patient 04/14/14 0155     Chief Complaint  Patient presents with  . Exposure to STD     (Consider location/radiation/quality/duration/timing/severity/associated sxs/prior Treatment) HPI Comments: Patient is an otherwise healthy 28 year old male presenting to the emergency department for a 2 day history of dysuria with associated throbbing at the tip of his penis that lasts only a few minutes after urinating. He does admit to having recent unprotected sexual intercourse prior to the onset of symptoms. He is concerned about sexual transmitted diseases. He denies any fevers, chills, nausea, vomiting, abdominal pain, penile or testicular or scrotal swelling. He denies any penile discharge.  Patient is a 28 y.o. male presenting with STD exposure.  Exposure to STD    Past Medical History  Diagnosis Date  . Chlamydia    History reviewed. No pertinent past surgical history. History reviewed. No pertinent family history. History  Substance Use Topics  . Smoking status: Never Smoker   . Smokeless tobacco: Not on file  . Alcohol Use: No    Review of Systems  Genitourinary: Positive for dysuria and penile pain. Negative for discharge, penile swelling, scrotal swelling and testicular pain.  All other systems reviewed and are negative.     Allergies  Review of patient's allergies indicates no known allergies.  Home Medications   Prior to Admission medications   Medication Sig Start Date End Date Taking? Authorizing Provider  DM-Doxylamine-Acetaminophen (NYQUIL COLD & FLU PO) Take 30 mLs by mouth at bedtime as needed. Cough   Yes Historical Provider, MD  acyclovir (ZOVIRAX) 400 MG tablet Take 1 tablet (400 mg total) by mouth 3 (three) times daily. 10/07/13   Marissa Sciacca, PA-C  HYDROcodone-acetaminophen (NORCO/VICODIN) 5-325 MG per tablet Take 1 tablet by mouth every 4 (four) hours as  needed for moderate pain. 08/04/13   Arie Sabinaatherine E Schinlever, PA-C  ibuprofen (ADVIL,MOTRIN) 800 MG tablet Take 1 tablet (800 mg total) by mouth 3 (three) times daily. 08/04/13   Arie Sabinaatherine E Schinlever, PA-C   BP 139/85 mmHg  Pulse 68  Temp(Src) 97.8 F (36.6 C) (Oral)  Resp 18  Ht 6' (1.829 m)  Wt 230 lb (104.327 kg)  BMI 31.19 kg/m2  SpO2 99% Physical Exam  Constitutional: He is oriented to person, place, and time. He appears well-developed and well-nourished. No distress.  HENT:  Head: Normocephalic and atraumatic.  Right Ear: External ear normal.  Left Ear: External ear normal.  Nose: Nose normal.  Mouth/Throat: Oropharynx is clear and moist.  Eyes: Conjunctivae are normal.  Neck: Normal range of motion. Neck supple.  Cardiovascular: Normal rate, regular rhythm and normal heart sounds.   Pulmonary/Chest: Effort normal and breath sounds normal.  Abdominal: Soft.  Genitourinary: Testes normal and penis normal. Right testis shows no swelling and no tenderness. Left testis shows no swelling and no tenderness. Circumcised. No penile erythema. No discharge found.  Musculoskeletal: Normal range of motion.  Neurological: He is alert and oriented to person, place, and time.  Skin: Skin is warm and dry. He is not diaphoretic.  Psychiatric: He has a normal mood and affect.  Nursing note and vitals reviewed.   ED Course  Procedures (including critical care time) Medications  cefTRIAXone (ROCEPHIN) injection 250 mg (250 mg Intramuscular Given 04/14/14 0258)  azithromycin (ZITHROMAX) tablet 1,000 mg (1,000 mg Oral Given 04/14/14 0256)    Labs Review Labs Reviewed  URINALYSIS, ROUTINE  W REFLEX MICROSCOPIC - Abnormal; Notable for the following:    APPearance TURBID (*)    All other components within normal limits  URINE MICROSCOPIC-ADD ON - Abnormal; Notable for the following:    Bacteria, UA FEW (*)    All other components within normal limits  URINE CULTURE  GC/CHLAMYDIA PROBE AMP     Imaging Review No results found.   EKG Interpretation None      MDM   Final diagnoses:  None    Filed Vitals:   04/14/14 0253  BP: 130/79  Pulse: 71  Temp: 97.9 F (36.6 C)  Resp: 20   Afebrile, NAD, non-toxic appearing, AAOx4. GU exam unremarkable. UA reviewed. GC chlamydia sent. Given possible exposure with history will prophylactically treat for gonorrhea and chlamydia with azithromycin and Rocephin. Advised patient he would be called in 48-72 hours for any positive results. He should refrain from sexual intercourse until then. He was advised to wait 10 days if he did receive a call notifying him of positive result. Return precautions were discussed. Patient is agreeable to plan. Patient stable at time of discharge.    Jeannetta EllisJennifer L Faaris Arizpe, PA-C 04/14/14 [id number]  Tomasita CrumbleAdeleke Oni, MD 04/14/14 1416

## 2014-04-14 NOTE — ED Notes (Signed)
Pt c/o urning upon urination x 2 days, as well as throbbing at the tip of the penis lasting only a few minutes after urination. Pt admits to having unprotected sex recently and is concerned about STDs.

## 2014-04-16 LAB — URINE CULTURE
COLONY COUNT: NO GROWTH
CULTURE: NO GROWTH
Special Requests: NORMAL

## 2014-04-16 LAB — GC/CHLAMYDIA PROBE AMP
CT Probe RNA: NEGATIVE
GC Probe RNA: NEGATIVE

## 2014-04-30 ENCOUNTER — Encounter (HOSPITAL_COMMUNITY): Payer: Self-pay | Admitting: *Deleted

## 2014-04-30 ENCOUNTER — Emergency Department (HOSPITAL_COMMUNITY)
Admission: EM | Admit: 2014-04-30 | Discharge: 2014-04-30 | Disposition: A | Discharge status: Home / Self Care | Payer: Self-pay | Attending: Emergency Medicine | Admitting: Emergency Medicine

## 2014-04-30 ENCOUNTER — Emergency Department (HOSPITAL_COMMUNITY): Payer: Self-pay

## 2014-04-30 DIAGNOSIS — Y9389 Activity, other specified: Secondary | ICD-10-CM | POA: Insufficient documentation

## 2014-04-30 DIAGNOSIS — Z87891 Personal history of nicotine dependence: Secondary | ICD-10-CM | POA: Insufficient documentation

## 2014-04-30 DIAGNOSIS — Z791 Long term (current) use of non-steroidal anti-inflammatories (NSAID): Secondary | ICD-10-CM | POA: Insufficient documentation

## 2014-04-30 DIAGNOSIS — Y92410 Unspecified street and highway as the place of occurrence of the external cause: Secondary | ICD-10-CM | POA: Insufficient documentation

## 2014-04-30 DIAGNOSIS — Y998 Other external cause status: Secondary | ICD-10-CM | POA: Insufficient documentation

## 2014-04-30 DIAGNOSIS — Z79899 Other long term (current) drug therapy: Secondary | ICD-10-CM | POA: Insufficient documentation

## 2014-04-30 DIAGNOSIS — Z87828 Personal history of other (healed) physical injury and trauma: Secondary | ICD-10-CM | POA: Insufficient documentation

## 2014-04-30 DIAGNOSIS — S134XXA Sprain of ligaments of cervical spine, initial encounter: Secondary | ICD-10-CM | POA: Insufficient documentation

## 2014-04-30 DIAGNOSIS — Z8619 Personal history of other infectious and parasitic diseases: Secondary | ICD-10-CM | POA: Insufficient documentation

## 2014-04-30 HISTORY — DX: Unspecified injury of shoulder and upper arm, unspecified arm, initial encounter: S49.90XA

## 2014-04-30 MED ORDER — KETOROLAC TROMETHAMINE 60 MG/2ML IM SOLN
60.0000 mg | Freq: Once | INTRAMUSCULAR | Status: AC
  Administered 2014-04-30: 60 mg via INTRAMUSCULAR
  Filled 2014-04-30: qty 2

## 2014-04-30 NOTE — ED Notes (Signed)
Patient transported to CT 
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
## 2014-04-30 NOTE — Discharge Instructions (Signed)
Concussion  A concussion, or closed-head injury, is a brain injury caused by a direct blow to the head or by a quick and sudden movement (jolt) of the head or neck. Concussions are usually not life-threatening. Even so, the effects of a concussion can be serious. If you have had a concussion before, you are more likely to experience concussion-like symptoms after a direct blow to the head.   CAUSES  · Direct blow to the head, such as from running into another player during a soccer game, being hit in a fight, or hitting your head on a hard surface.  · A jolt of the head or neck that causes the brain to move back and forth inside the skull, such as in a car crash.  SIGNS AND SYMPTOMS  The signs of a concussion can be hard to notice. Early on, they may be missed by you, family members, and health care providers. You may look fine but act or feel differently.  Symptoms are usually temporary, but they may last for days, weeks, or even longer. Some symptoms may appear right away while others may not show up for hours or days. Every head injury is different. Symptoms include:  · Mild to moderate headaches that will not go away.  · A feeling of pressure inside your head.  · Having more trouble than usual:  ¨ Learning or remembering things you have heard.  ¨ Answering questions.  ¨ Paying attention or concentrating.  ¨ Organizing daily tasks.  ¨ Making decisions and solving problems.  · Slowness in thinking, acting or reacting, speaking, or reading.  · Getting lost or being easily confused.  · Feeling tired all the time or lacking energy (fatigued).  · Feeling drowsy.  · Sleep disturbances.  ¨ Sleeping more than usual.  ¨ Sleeping less than usual.  ¨ Trouble falling asleep.  ¨ Trouble sleeping (insomnia).  · Loss of balance or feeling lightheaded or dizzy.  · Nausea or vomiting.  · Numbness or tingling.  · Increased sensitivity to:  ¨ Sounds.  ¨ Lights.  ¨ Distractions.  · Vision problems or eyes that tire  easily.  · Diminished sense of taste or smell.  · Ringing in the ears.  · Mood changes such as feeling sad or anxious.  · Becoming easily irritated or angry for little or no reason.  · Lack of motivation.  · Seeing or hearing things other people do not see or hear (hallucinations).  DIAGNOSIS  Your health care provider can usually diagnose a concussion based on a description of your injury and symptoms. He or she will ask whether you passed out (lost consciousness) and whether you are having trouble remembering events that happened right before and during your injury.  Your evaluation might include:  · A brain scan to look for signs of injury to the brain. Even if the test shows no injury, you may still have a concussion.  · Blood tests to be sure other problems are not present.  TREATMENT  · Concussions are usually treated in an emergency department, in urgent care, or at a clinic. You may need to stay in the hospital overnight for further treatment.  · Tell your health care provider if you are taking any medicines, including prescription medicines, over-the-counter medicines, and natural remedies. Some medicines, such as blood thinners (anticoagulants) and aspirin, may increase the chance of complications. Also tell your health care provider whether you have had alcohol or are taking illegal drugs. This information   may affect treatment.  · Your health care provider will send you home with important instructions to follow.  · How fast you will recover from a concussion depends on many factors. These factors include how severe your concussion is, what part of your brain was injured, your age, and how healthy you were before the concussion.  · Most people with mild injuries recover fully. Recovery can take time. In general, recovery is slower in older persons. Also, persons who have had a concussion in the past or have other medical problems may find that it takes longer to recover from their current injury.  HOME  CARE INSTRUCTIONS  General Instructions  · Carefully follow the directions your health care provider gave you.  · Only take over-the-counter or prescription medicines for pain, discomfort, or fever as directed by your health care provider.  · Take only those medicines that your health care provider has approved.  · Do not drink alcohol until your health care provider says you are well enough to do so. Alcohol and certain other drugs may slow your recovery and can put you at risk of further injury.  · If it is harder than usual to remember things, write them down.  · If you are easily distracted, try to do one thing at a time. For example, do not try to watch TV while fixing dinner.  · Talk with family members or close friends when making important decisions.  · Keep all follow-up appointments. Repeated evaluation of your symptoms is recommended for your recovery.  · Watch your symptoms and tell others to do the same. Complications sometimes occur after a concussion. Older adults with a brain injury may have a higher risk of serious complications, such as a blood clot on the brain.  · Tell your teachers, school nurse, school counselor, coach, athletic trainer, or work manager about your injury, symptoms, and restrictions. Tell them about what you can or cannot do. They should watch for:  ¨ Increased problems with attention or concentration.  ¨ Increased difficulty remembering or learning new information.  ¨ Increased time needed to complete tasks or assignments.  ¨ Increased irritability or decreased ability to cope with stress.  ¨ Increased symptoms.  · Rest. Rest helps the brain to heal. Make sure you:  ¨ Get plenty of sleep at night. Avoid staying up late at night.  ¨ Keep the same bedtime hours on weekends and weekdays.  ¨ Rest during the day. Take daytime naps or rest breaks when you feel tired.  · Limit activities that require a lot of thought or concentration. These include:  ¨ Doing homework or job-related  work.  ¨ Watching TV.  ¨ Working on the computer.  · Avoid any situation where there is potential for another head injury (football, hockey, soccer, basketball, martial arts, downhill snow sports and horseback riding). Your condition will get worse every time you experience a concussion. You should avoid these activities until you are evaluated by the appropriate follow-up health care providers.  Returning To Your Regular Activities  You will need to return to your normal activities slowly, not all at once. You must give your body and brain enough time for recovery.  · Do not return to sports or other athletic activities until your health care provider tells you it is safe to do so.  · Ask your health care provider when you can drive, ride a bicycle, or operate heavy machinery. Your ability to react may be slower after a   brain injury. Never do these activities if you are dizzy.  · Ask your health care provider about when you can return to work or school.  Preventing Another Concussion  It is very important to avoid another brain injury, especially before you have recovered. In rare cases, another injury can lead to permanent brain damage, brain swelling, or death. The risk of this is greatest during the first 7-10 days after a head injury. Avoid injuries by:  · Wearing a seat belt when riding in a car.  · Drinking alcohol only in moderation.  · Wearing a helmet when biking, skiing, skateboarding, skating, or doing similar activities.  · Avoiding activities that could lead to a second concussion, such as contact or recreational sports, until your health care provider says it is okay.  · Taking safety measures in your home.  ¨ Remove clutter and tripping hazards from floors and stairways.  ¨ Use grab bars in bathrooms and handrails by stairs.  ¨ Place non-slip mats on floors and in bathtubs.  ¨ Improve lighting in dim areas.  SEEK MEDICAL CARE IF:  · You have increased problems paying attention or  concentrating.  · You have increased difficulty remembering or learning new information.  · You need more time to complete tasks or assignments than before.  · You have increased irritability or decreased ability to cope with stress.  · You have more symptoms than before.  Seek medical care if you have any of the following symptoms for more than 2 weeks after your injury:  · Lasting (chronic) headaches.  · Dizziness or balance problems.  · Nausea.  · Vision problems.  · Increased sensitivity to noise or light.  · Depression or mood swings.  · Anxiety or irritability.  · Memory problems.  · Difficulty concentrating or paying attention.  · Sleep problems.  · Feeling tired all the time.  SEEK IMMEDIATE MEDICAL CARE IF:  · You have severe or worsening headaches. These may be a sign of a blood clot in the brain.  · You have weakness (even if only in one hand, leg, or part of the face).  · You have numbness.  · You have decreased coordination.  · You vomit repeatedly.  · You have increased sleepiness.  · One pupil is larger than the other.  · You have convulsions.  · You have slurred speech.  · You have increased confusion. This may be a sign of a blood clot in the brain.  · You have increased restlessness, agitation, or irritability.  · You are unable to recognize people or places.  · You have neck pain.  · It is difficult to wake you up.  · You have unusual behavior changes.  · You lose consciousness.  MAKE SURE YOU:  · Understand these instructions.  · Will watch your condition.  · Will get help right away if you are not doing well or get worse.  Document Released: 08/15/2003 Document Revised: 05/30/2013 Document Reviewed: 12/15/2012  [employer]® Patient Information ©2015 [employer]. This information is not intended to replace advice given to you by your health care provider. Make sure you discuss any questions you have with your health care provider.

## 2014-04-30 NOTE — ED Notes (Signed)
Dr Davis at bedside.
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
## 2015-11-27 ENCOUNTER — Emergency Department (HOSPITAL_COMMUNITY)
Admission: EM | Admit: 2015-11-27 | Discharge: 2015-11-27 | Disposition: A | Discharge status: Home / Self Care | Payer: No Typology Code available for payment source | Attending: Emergency Medicine | Admitting: Emergency Medicine

## 2015-11-27 ENCOUNTER — Encounter (HOSPITAL_COMMUNITY): Payer: Self-pay | Admitting: *Deleted

## 2015-11-27 DIAGNOSIS — Z87891 Personal history of nicotine dependence: Secondary | ICD-10-CM | POA: Insufficient documentation

## 2015-11-27 DIAGNOSIS — Z202 Contact with and (suspected) exposure to infections with a predominantly sexual mode of transmission: Secondary | ICD-10-CM | POA: Insufficient documentation

## 2015-11-27 DIAGNOSIS — R300 Dysuria: Secondary | ICD-10-CM

## 2015-11-27 LAB — URINALYSIS, ROUTINE W REFLEX MICROSCOPIC
Bilirubin Urine: NEGATIVE
GLUCOSE, UA: NEGATIVE mg/dL
HGB URINE DIPSTICK: NEGATIVE
Ketones, ur: NEGATIVE mg/dL
Leukocytes, UA: NEGATIVE
Nitrite: NEGATIVE
Protein, ur: NEGATIVE mg/dL
SPECIFIC GRAVITY, URINE: 1.027 (ref 1.005–1.030)
pH: 6 (ref 5.0–8.0)

## 2015-11-27 MED ORDER — AZITHROMYCIN 250 MG PO TABS
1000.0000 mg | ORAL_TABLET | Freq: Every day | ORAL | Status: DC
  Administered 2015-11-27: 1000 mg via ORAL
  Filled 2015-11-27: qty 4

## 2015-11-27 MED ORDER — CEFTRIAXONE SODIUM 250 MG IJ SOLR
250.0000 mg | Freq: Once | INTRAMUSCULAR | Status: AC
  Administered 2015-11-27: 250 mg via INTRAMUSCULAR
  Filled 2015-11-27: qty 250

## 2015-11-27 NOTE — ED Notes (Addendum)
Patient c/o suprapubic pain and burning with urination with intermittent testicular pain since last week.  Patient denies hematuria.  Patient was seen at [facility] department last week and a urinalysis and STD testing was done.  Patient was told the results were negative, but he continues to have suprapubic pressure and pain and endorses urinary frequency.  Patient is not currently having testicular pain, but has pain with urination and suprapubic pain that is constant, sharp and throbbing.  Patient denies N/V/D and fever.  Patient is sexually active and uses condoms, but stated 2 weeks ago the condom broke during intercourse.  Patient is uncertain if he has had penile discharge.

## 2015-11-27 NOTE — ED Provider Notes (Signed)
CSN: [id number]     Arrival date & time 11/27/15  1144 History   First MD Initiated Contact with Patient 11/27/15 1338     Chief Complaint  Patient presents with  . Dysuria     (Consider location/radiation/quality/duration/timing/severity/associated sxs/prior Treatment) HPI 30 year old male who presents with dysuria. States one week of burning with urination and suprapubic tenderness during her urination. Initially was seen at the health department, had unremarkable urinalysis and reports STD testing was performed although he was not treated. He is unsure of what his results were. He does state risk for STDs recently, and currently is sexually active. Feels that he may have been exposed to STDs recently. During the week he did initially note some testicular discomfort, although denies any testicular pain currently, testicular swelling or mass, or overlying skin changes. Return to ED for second evaluation. Denies fever, chills, nausea vomiting, diarrhea. Unsure about penile discharge.   Past Medical History  Diagnosis Date  . Chlamydia   . Shoulder injury    History reviewed. No pertinent past surgical history. History reviewed. No pertinent family history. Social History  Substance Use Topics  . Smoking status: Former Smoker    Quit date: 04/30/2013  . Smokeless tobacco: Former NeurosurgeonUser  . Alcohol Use: No    Review of Systems 10/14 systems reviewed and are negative other than those stated in the HPI   Allergies  Review of patient's allergies indicates no known allergies.  Home Medications   Prior to Admission medications   Not on File   BP 123/85 mmHg  Pulse 62  Temp(Src) 98.3 F (36.8 C) (Oral)  Resp 16  Ht 5\' 11"  (1.803 m)  Wt 220 lb (99.791 kg)  BMI 30.70 kg/m2  SpO2 100% Physical Exam Physical Exam  Nursing note and vitals reviewed. Constitutional: Well developed, well nourished, non-toxic, and in no acute distress Head: Normocephalic and atraumatic.   Mouth/Throat: Oropharynx is clear and moist.  Neck: Normal range of motion. Neck supple.  Cardiovascular: Normal rate and regular rhythm.   Pulmonary/Chest: Effort normal and breath sounds normal.  Abdominal: Soft. There is minimal suprapubic tenderness. There is no rebound and no guarding.  GU: Normal external genitalia. No lesions or rashes. No testicular pain, swelling, mass. Normal testicular lie. Normal cremasteric reflexes. No penile discharge Musculoskeletal: Normal range of motion.  Neurological: Alert, no facial droop, fluent speech, moves all extremities symmetrically Skin: Skin is warm and dry.  Psychiatric: Cooperative  ED Course  Procedures (including critical care time) Labs Review Labs Reviewed  URINALYSIS, ROUTINE W REFLEX MICROSCOPIC (NOT [facility])  GC/CHLAMYDIA PROBE AMP () NOT AT [facility]    Imaging Review No results found. I have personally reviewed and evaluated these images and lab results as part of my medical decision-making.   EKG Interpretation None      MDM   Final diagnoses:  Dysuria  STD exposure   30 year old male who presents with persistent dysuria and suprapubic pressure after recent potential STD exposure. I urinalysis is normal. GU exam is unremarkable and abdominal exam is benign. We'll treat empirically with ceftriaxone and azithromycin. Discussed strict return instructions. If still having persistent symptoms, was given urology follow-up.    Lavera Guiseana Duo Alcario Tinkey, MD 11/27/15 [phone]

## 2015-11-27 NOTE — ED Notes (Signed)
Waited a few minutes in ensure no reaction to medications

## 2015-11-27 NOTE — Discharge Instructions (Signed)
Your urine does not show infection.  You were treated for potential STD exposure but confirmatory swabs were sent.  If you have persistent urinary symptoms after treatment, you may require urology follow-up listed above.  Return for worsening symptoms, including fever, vomiting and unable to keep down food/fluids, severe abdominal pain or any other symptoms concerning to you.

## 2015-11-28 LAB — GC/CHLAMYDIA PROBE AMP (~~LOC~~) NOT AT ARMC
CHLAMYDIA, DNA PROBE: NEGATIVE
NEISSERIA GONORRHEA: NEGATIVE

## 2015-11-28 LAB — CYTOLOGY, (ORAL, ANAL, URETHRAL) ANCILLARY ONLY
Chlamydia: NEGATIVE
Neisseria Gonorrhea: NEGATIVE

## 2016-02-17 ENCOUNTER — Encounter (HOSPITAL_COMMUNITY): Payer: Self-pay | Admitting: *Deleted

## 2016-02-17 DIAGNOSIS — K2970 Gastritis, unspecified, without bleeding: Secondary | ICD-10-CM | POA: Insufficient documentation

## 2016-02-17 DIAGNOSIS — Z87891 Personal history of nicotine dependence: Secondary | ICD-10-CM | POA: Insufficient documentation

## 2016-02-17 LAB — CBC
HEMATOCRIT: 41.9 % (ref 39.0–52.0)
HEMOGLOBIN: 14.3 g/dL (ref 13.0–17.0)
MCH: 29.6 pg (ref 26.0–34.0)
MCHC: 34.1 g/dL (ref 30.0–36.0)
MCV: 86.7 fL (ref 78.0–100.0)
Platelets: 254 10*3/uL (ref 150–400)
RBC: 4.83 MIL/uL (ref 4.22–5.81)
RDW: 12.9 % (ref 11.5–15.5)
WBC: 6 10*3/uL (ref 4.0–10.5)

## 2016-02-17 LAB — COMPREHENSIVE METABOLIC PANEL
ALBUMIN: 4.2 g/dL (ref 3.5–5.0)
ALK PHOS: 42 U/L (ref 38–126)
ALT: 39 U/L (ref 17–63)
ANION GAP: 7 (ref 5–15)
AST: 62 U/L — ABNORMAL HIGH (ref 15–41)
BUN: 9 mg/dL (ref 6–20)
CALCIUM: 9.1 mg/dL (ref 8.9–10.3)
CHLORIDE: 105 mmol/L (ref 101–111)
CO2: 25 mmol/L (ref 22–32)
Creatinine, Ser: 1.05 mg/dL (ref 0.61–1.24)
GFR calc Af Amer: >60 mL/min (ref >60)
GFR calc non Af Amer: >60 mL/min (ref >60)
GLUCOSE: 95 mg/dL (ref 65–99)
Potassium: 3.9 mmol/L (ref 3.5–5.1)
Sodium: 137 mmol/L (ref 135–145)
Total Bilirubin: 1.1 mg/dL (ref 0.3–1.2)
Total Protein: 6.9 g/dL (ref 6.5–8.1)

## 2016-02-17 LAB — URINALYSIS, ROUTINE W REFLEX MICROSCOPIC
BILIRUBIN URINE: NEGATIVE
GLUCOSE, UA: NEGATIVE mg/dL
Hgb urine dipstick: NEGATIVE
Ketones, ur: NEGATIVE mg/dL
Leukocytes, UA: NEGATIVE
Nitrite: NEGATIVE
PH: 7 (ref 5.0–8.0)
Protein, ur: NEGATIVE mg/dL
Specific Gravity, Urine: 1.02 (ref 1.005–1.030)

## 2016-02-17 LAB — LIPASE, BLOOD: LIPASE: 28 U/L (ref 11–51)

## 2016-02-17 NOTE — ED Triage Notes (Signed)
Pt c/o intermittent upper abdominal pain since Saturday. Pt denies n/v/d. Eating and drinking increases pain.

## 2016-02-18 ENCOUNTER — Emergency Department (HOSPITAL_COMMUNITY)
Admission: EM | Admit: 2016-02-18 | Discharge: 2016-02-18 | Disposition: A | Discharge status: Home / Self Care | Payer: Self-pay | Attending: Emergency Medicine | Admitting: Emergency Medicine

## 2016-02-18 DIAGNOSIS — K2970 Gastritis, unspecified, without bleeding: Secondary | ICD-10-CM

## 2016-02-18 MED ORDER — GI COCKTAIL ~~LOC~~
30.0000 mL | Freq: Once | ORAL | Status: AC
  Administered 2016-02-18: 30 mL via ORAL
  Filled 2016-02-18: qty 30

## 2016-02-18 MED ORDER — OMEPRAZOLE 20 MG PO CPDR: 20.0000 mg | DELAYED_RELEASE_CAPSULE | Freq: Every day | ORAL | 1 refills | Status: DC

## 2016-02-18 NOTE — Discharge Instructions (Signed)
Take Prilosec daily as prescribed. Follow-up with a primary care doctor regarding your visit to the emergency department today. Avoid spicy and acidic foods as well as beer and coffee as these may aggravate your symptoms. You may return for any new or concerning symptoms.

## 2016-02-18 NOTE — ED Provider Notes (Signed)
[facility]-EMERGENCY DEPT Provider Note   CSN: [id number] Arrival date & time: 02/17/16  2240    History   Chief Complaint Chief Complaint  Patient presents with  . Abdominal Pain    HPI Kenneth Tanner is a 30 y.o. male.  30 year old male with a history of shoulder injury presents to the emergency department today for evaluation of abdominal pain. Patient reports that he has had intermittent abdominal pain over the past 2 days. Pain is sharp and has been worsened with eating or drinking today. He states the pain was initially across his entire upper abdomen tonight. It has improved to pain localized to his left upper quadrant. Patient denies taking any medications for symptoms prior to arrival. He has had 5 normal bowel movements today. No associated nausea, vomiting, melena, hematochezia, urinary symptoms, or fever. No history of abdominal surgeries.   The history is provided by the patient. No language interpreter was used.  Abdominal Pain   Pertinent negatives include fever, diarrhea, nausea and vomiting.    Past Medical History:  Diagnosis Date  . Chlamydia   . Shoulder injury     There are no active problems to display for this patient.   History reviewed. No pertinent surgical history.    Home Medications    Prior to Admission medications   Not on File    Family History History reviewed. No pertinent family history.  Social History Social History  Substance Use Topics  . Smoking status: Former Smoker    Quit date: 04/30/2013  . Smokeless tobacco: Former Neurosurgeon  . Alcohol use No     Allergies   Review of patient's allergies indicates no known allergies.   Review of Systems Review of Systems  Constitutional: Negative for fever.  Gastrointestinal: Positive for abdominal pain. Negative for blood in stool, diarrhea, nausea and vomiting.  Ten systems reviewed and are negative for acute change, except as noted in the HPI.     Physical Exam Updated Vital  Signs BP 131/80 (BP Location: Left Arm)   Pulse 63   Temp 98.1 F (36.7 C) (Oral)   Resp 14   Ht 5\' 11"  (1.803 m)   Wt 97.5 kg   SpO2 100%   BMI 29.99 kg/m   Physical Exam  Constitutional: He is oriented to person, place, and time. He appears well-developed and well-nourished. No distress.  Nontoxic-appearing and in no distress.  HENT:  Head: Normocephalic and atraumatic.  Eyes: Conjunctivae and EOM are normal. No scleral icterus.  Neck: Normal range of motion.  Cardiovascular: Normal rate, regular rhythm and intact distal pulses.   Pulmonary/Chest: Effort normal. No respiratory distress. He has no wheezes. He has no rales.  Lungs clear to auscultation bilaterally.  Abdominal: Soft. He exhibits no distension and no mass. There is no tenderness. There is no rebound and no guarding.  No reproducible abdominal tenderness to palpation. Negative Murphy sign. No guarding or masses. No peritoneal signs.  Musculoskeletal: Normal range of motion.  Neurological: He is alert and oriented to person, place, and time.  Skin: Skin is warm and dry. No rash noted. He is not diaphoretic. No erythema. No pallor.  Psychiatric: He has a normal mood and affect. His behavior is normal.  Nursing note and vitals reviewed.    ED Treatments / Results  Labs (all labs ordered are listed, but only abnormal results are displayed) Labs Reviewed  COMPREHENSIVE METABOLIC PANEL - Abnormal; Notable for the following:       Result Value  AST 62 (*)    All other components within normal limits  LIPASE, BLOOD  CBC  URINALYSIS, ROUTINE W REFLEX MICROSCOPIC (NOT AT [facility])    EKG  EKG Interpretation None       Radiology No results found.  Procedures Procedures (including critical care time)   Medications Ordered in ED Medications  gi cocktail (Maalox,Lidocaine,Donnatal) (not administered)     Initial Impression / Assessment and Plan / ED Course  I have reviewed the triage vital signs and the  nursing notes.  Pertinent labs & imaging results that were available during my care of the patient were reviewed by me and considered in my medical decision making (see chart for details).  Clinical Course    1:28 AM Patient reassessed. He reports improvement in pain following GI cocktail. Suspect symptoms to be secondary to gastritis. Abdominal repeat exam is stable. Labs reassuring and noncontributory. Patient has no fever or leukocytosis today. Plan to start on Prilosec daily and have patient follow up with a PCP regarding his visit today. Return precautions discussed and provided. Patient discharged in satisfactory condition with no unaddressed concerns.   Final Clinical Impressions(s) / ED Diagnoses   Final diagnoses:  Gastritis    New Prescriptions New Prescriptions   OMEPRAZOLE (PRILOSEC) 20 MG CAPSULE    Take 1 capsule (20 mg total) by mouth daily.     Antony MaduraKelly Rayshad Riviello, PA-C 02/18/16 0131    Marily MemosJason Mesner, MD 02/18/16 [phone]

## 2016-06-09 ENCOUNTER — Emergency Department (HOSPITAL_COMMUNITY): Payer: Self-pay

## 2016-06-09 ENCOUNTER — Encounter (HOSPITAL_COMMUNITY): Payer: Self-pay | Admitting: Emergency Medicine

## 2016-06-09 ENCOUNTER — Emergency Department (HOSPITAL_COMMUNITY)
Admission: EM | Admit: 2016-06-09 | Discharge: 2016-06-09 | Disposition: A | Discharge status: Home / Self Care | Payer: Self-pay | Attending: Emergency Medicine | Admitting: Emergency Medicine

## 2016-06-09 DIAGNOSIS — K6289 Other specified diseases of anus and rectum: Secondary | ICD-10-CM

## 2016-06-09 DIAGNOSIS — IMO0001 Reserved for inherently not codable concepts without codable children: Secondary | ICD-10-CM

## 2016-06-09 DIAGNOSIS — R112 Nausea with vomiting, unspecified: Secondary | ICD-10-CM

## 2016-06-09 DIAGNOSIS — T6291XA Toxic effect of unspecified noxious substance eaten as food, accidental (unintentional), initial encounter: Secondary | ICD-10-CM | POA: Insufficient documentation

## 2016-06-09 DIAGNOSIS — Z87891 Personal history of nicotine dependence: Secondary | ICD-10-CM | POA: Insufficient documentation

## 2016-06-09 DIAGNOSIS — R1030 Lower abdominal pain, unspecified: Secondary | ICD-10-CM

## 2016-06-09 DIAGNOSIS — R1084 Generalized abdominal pain: Secondary | ICD-10-CM | POA: Insufficient documentation

## 2016-06-09 DIAGNOSIS — K648 Other hemorrhoids: Secondary | ICD-10-CM

## 2016-06-09 LAB — COMPREHENSIVE METABOLIC PANEL
ALT: 28 U/L (ref 17–63)
AST: 32 U/L (ref 15–41)
Albumin: 4.3 g/dL (ref 3.5–5.0)
Alkaline Phosphatase: 54 U/L (ref 38–126)
Anion gap: 6 (ref 5–15)
BUN: 9 mg/dL (ref 6–20)
CHLORIDE: 106 mmol/L (ref 101–111)
CO2: 25 mmol/L (ref 22–32)
CREATININE: 0.86 mg/dL (ref 0.61–1.24)
Calcium: 9.1 mg/dL (ref 8.9–10.3)
GFR calc non Af Amer: >60 mL/min (ref >60)
Glucose, Bld: 100 mg/dL — ABNORMAL HIGH (ref 65–99)
POTASSIUM: 3.8 mmol/L (ref 3.5–5.1)
SODIUM: 137 mmol/L (ref 135–145)
Total Bilirubin: 0.7 mg/dL (ref 0.3–1.2)
Total Protein: 7.5 g/dL (ref 6.5–8.1)

## 2016-06-09 LAB — URINALYSIS, ROUTINE W REFLEX MICROSCOPIC
Bilirubin Urine: NEGATIVE
Glucose, UA: NEGATIVE mg/dL
Hgb urine dipstick: NEGATIVE
KETONES UR: NEGATIVE mg/dL
LEUKOCYTES UA: NEGATIVE
NITRITE: NEGATIVE
PROTEIN: NEGATIVE mg/dL
Specific Gravity, Urine: 1.013 (ref 1.005–1.030)
pH: 7 (ref 5.0–8.0)

## 2016-06-09 LAB — CBC
HEMATOCRIT: 40.1 % (ref 39.0–52.0)
HEMOGLOBIN: 14.6 g/dL (ref 13.0–17.0)
MCH: 30 pg (ref 26.0–34.0)
MCHC: 36.4 g/dL — ABNORMAL HIGH (ref 30.0–36.0)
MCV: 82.5 fL (ref 78.0–100.0)
Platelets: 253 10*3/uL (ref 150–400)
RBC: 4.86 MIL/uL (ref 4.22–5.81)
RDW: 12.9 % (ref 11.5–15.5)
WBC: 5.1 10*3/uL (ref 4.0–10.5)

## 2016-06-09 LAB — TYPE AND SCREEN
ABO/RH(D): O POS
Antibody Screen: NEGATIVE

## 2016-06-09 LAB — POC OCCULT BLOOD, ED: Fecal Occult Bld: NEGATIVE

## 2016-06-09 LAB — ABO/RH: ABO/RH(D): O POS

## 2016-06-09 LAB — LIPASE, BLOOD: Lipase: 27 U/L (ref 11–51)

## 2016-06-09 MED ORDER — HYDROCORTISONE ACETATE 25 MG RE SUPP: 25.0000 mg | Freq: Two times a day (BID) | RECTAL | 0 refills | Status: DC

## 2016-06-09 MED ORDER — ONDANSETRON HCL 4 MG/2ML IJ SOLN
4.0000 mg | Freq: Once | INTRAMUSCULAR | Status: AC
  Administered 2016-06-09: 4 mg via INTRAVENOUS
  Filled 2016-06-09: qty 2

## 2016-06-09 MED ORDER — ONDANSETRON 4 MG PO TBDP: 4.0000 mg | ORAL_TABLET | Freq: Three times a day (TID) | ORAL | 0 refills | Status: DC | PRN

## 2016-06-09 MED ORDER — FAMOTIDINE IN NACL 20-0.9 MG/50ML-% IV SOLN
20.0000 mg | Freq: Once | INTRAVENOUS | Status: AC
  Administered 2016-06-09: 20 mg via INTRAVENOUS
  Filled 2016-06-09: qty 50

## 2016-06-09 MED ORDER — SODIUM CHLORIDE 0.9 % IV BOLUS (SEPSIS)
1000.0000 mL | Freq: Once | INTRAVENOUS | Status: AC
  Administered 2016-06-09: 1000 mL via INTRAVENOUS

## 2016-06-09 MED ORDER — IOPAMIDOL (ISOVUE-300) INJECTION 61%
100.0000 mL | Freq: Once | INTRAVENOUS | Status: AC | PRN
  Administered 2016-06-09: 15 mL via INTRAVENOUS
  Administered 2016-06-09: 100 mL via INTRAVENOUS

## 2016-06-09 MED ORDER — IOPAMIDOL (ISOVUE-300) INJECTION 61%
INTRAVENOUS | Status: AC
  Filled 2016-06-09: qty 100

## 2016-06-09 MED ORDER — IOPAMIDOL (ISOVUE-300) INJECTION 61%
30.0000 mL | Freq: Once | INTRAVENOUS | Status: AC | PRN
  Administered 2016-06-09: 30 mL via ORAL

## 2016-06-09 MED ORDER — RANITIDINE HCL 150 MG PO TABS: 150.0000 mg | ORAL_TABLET | Freq: Two times a day (BID) | ORAL | 0 refills | Status: DC

## 2016-06-09 MED ORDER — IOPAMIDOL (ISOVUE-300) INJECTION 61%
INTRAVENOUS | Status: AC
  Administered 2016-06-09: 15 mL via INTRAVENOUS
  Filled 2016-06-09: qty 30

## 2016-06-09 NOTE — ED Notes (Signed)
Pt transported to CT ?

## 2016-06-09 NOTE — ED Provider Notes (Signed)
[facility]-EMERGENCY DEPT Provider Note   CSN: [id number] Arrival date & time: 06/09/16  0915     History   Chief Complaint Chief Complaint  Patient presents with  . Abdominal Pain    HPI Kenneth Tanner is a 31 y.o. male who presents to the ED with complaints of 5 days of lower abdominal pain that began Thursday morning after he ate [employer] Wednesday night. He describes his pain as 2/10 intermittent sharp nonradiating lower abdominal pain worse with eating and somewhat improved with Pepto-Bismol and ginger ale. He states he had one episode of nonbloody nonbilious emesis on Friday (4 days ago) but none since then. He endorses ongoing nausea however denies ongoing vomiting. He also reports to black stools yesterday (after pepto bismol) but denies that they were tarry. Additionally endorses scant BRBPR on the toilet paper when he wipes, and has rectal pain only with BMs, but denies that he's constipated or straining to have a BM.   He denies fevers, chills, CP, SOB, ongoing vomiting, diarrhea, constipation, obstipation, hematuria, dysuria, penile discharge, testicular pain/swelling, myalgias, arthralgias, numbness, tingling, weakness, lightheadedness, or any other complaints at this time. Denies recent travel, sick contacts, EtOH use, or chronic NSAID use. States he's otherwise healthy with no medical problems, denies ever having hemorrhoids or similar symptoms to today's symptoms. Endorses being sexually active with 4 male partners in the last 1 year, all protected with condoms.    The history is provided by the patient and medical records. No language interpreter was used.  Abdominal Pain   This is a new problem. The current episode started more than 2 days ago. Episode frequency: intermittently. The problem has not changed since onset.The pain is associated with suspicious food intake. The pain is located in the suprapubic region, RLQ and LLQ. The quality of the pain is sharp. The pain is at a  severity of 2/10. The pain is mild. Associated symptoms include hematochezia (BRBPR on toilet paper with wiping), melena (dark stool after pepto bismol), nausea and vomiting (once 4 days ago, none since then). Pertinent negatives include fever, diarrhea, flatus, constipation, dysuria, hematuria, arthralgias and myalgias. The symptoms are aggravated by eating. The symptoms are relieved by OTC medications.    Past Medical History:  Diagnosis Date  . Chlamydia   . Shoulder injury     There are no active problems to display for this patient.   History reviewed. No pertinent surgical history.     Home Medications    Prior to Admission medications   Medication Sig Start Date End Date Taking? Authorizing Provider  omeprazole (PRILOSEC) 20 MG capsule Take 1 capsule (20 mg total) by mouth daily. 02/18/16   Antony MaduraKelly Humes, PA-C    Family History No family history on file.  Social History Social History  Substance Use Topics  . Smoking status: Former Smoker    Quit date: 04/30/2013  . Smokeless tobacco: Former NeurosurgeonUser  . Alcohol use No     Allergies   Patient has no known allergies.   Review of Systems Review of Systems  Constitutional: Negative for chills and fever.  Respiratory: Negative for shortness of breath.   Cardiovascular: Negative for chest pain.  Gastrointestinal: Positive for abdominal pain, blood in stool, hematochezia (BRBPR on toilet paper with wiping), melena (dark stool after pepto bismol), nausea, rectal pain and vomiting (once 4 days ago, none since then). Negative for constipation, diarrhea and flatus.  Genitourinary: Negative for discharge, dysuria, hematuria, scrotal swelling and testicular pain.  Musculoskeletal:  Negative for arthralgias and myalgias.  Skin: Negative for color change.  Allergic/Immunologic: Negative for immunocompromised state.  Neurological: Negative for weakness and numbness.  Psychiatric/Behavioral: Negative for confusion.   10 Systems  reviewed and are negative for acute change except as noted in the HPI.   Physical Exam Updated Vital Signs BP 128/96   Pulse 85   Temp 98.1 F (36.7 C)   SpO2 99%   Physical Exam  Constitutional: He is oriented to person, place, and time. Vital signs are normal. He appears well-developed and well-nourished.  Non-toxic appearance. No distress.  Afebrile, nontoxic, NAD  HENT:  Head: Normocephalic and atraumatic.  Mouth/Throat: Oropharynx is clear and moist and mucous membranes are normal.  Eyes: Conjunctivae and EOM are normal. Right eye exhibits no discharge. Left eye exhibits no discharge.  Neck: Normal range of motion. Neck supple.  Cardiovascular: Normal rate, regular rhythm, normal heart sounds and intact distal pulses.  Exam reveals no gallop and no friction rub.   No murmur heard. Pulmonary/Chest: Effort normal and breath sounds normal. No respiratory distress. He has no decreased breath sounds. He has no wheezes. He has no rhonchi. He has no rales.  Abdominal: Soft. Normal appearance and bowel sounds are normal. He exhibits no distension. There is tenderness in the right lower quadrant, suprapubic area and left lower quadrant. There is no rigidity, no rebound, no guarding, no CVA tenderness, no tenderness at McBurney's point and negative Murphy's sign.    Soft, nondistended, +BS throughout, with mild diffuse lower abd TTP, no r/g/r, neg murphy's, neg mcburney's, no CVA TTP   Genitourinary: Rectal exam shows internal hemorrhoid and tenderness. Rectal exam shows no external hemorrhoid, no fissure, no mass, anal tone normal and guaiac negative stool. Prostate is tender. Prostate is not enlarged.  Genitourinary Comments: Chaperone present No gross blood noted on rectal exam, normal tone, no mass or fissure, no external hemorrhoids. Palpable internal hemorrhoids which do not prolapse with valsalva. Mild tenderness with rectal exam, primarily with palpation of the prostate. Prostate  without enlargement, warmth, or bogginess. FOBT neg.  Musculoskeletal: Normal range of motion.  Neurological: He is alert and oriented to person, place, and time. He has normal strength. No sensory deficit.  Skin: Skin is warm, dry and intact. No rash noted.  Psychiatric: He has a normal mood and affect.  Nursing note and vitals reviewed.    ED Treatments / Results  Labs (all labs ordered are listed, but only abnormal results are displayed) Labs Reviewed  COMPREHENSIVE METABOLIC PANEL - Abnormal; Notable for the following:       Result Value   Glucose, Bld 100 (*)    All other components within normal limits  CBC - Abnormal; Notable for the following:    MCHC 36.4 (*)    All other components within normal limits  LIPASE, BLOOD  URINALYSIS, ROUTINE W REFLEX MICROSCOPIC  RPR  HIV ANTIBODY (ROUTINE TESTING)  POC OCCULT BLOOD, ED  TYPE AND SCREEN  ABO/RH  GC/CHLAMYDIA PROBE AMP ([place]) NOT AT [facility]    EKG  EKG Interpretation None       Radiology Ct Abdomen Pelvis W Contrast  Result Date: 06/09/2016 CLINICAL DATA:  Low abdominal pain.  Diarrhea this morning. EXAM: CT ABDOMEN AND PELVIS WITH CONTRAST TECHNIQUE: Multidetector CT imaging of the abdomen and pelvis was performed using the standard protocol following bolus administration of intravenous contrast. CONTRAST:  ISOVUE-300 IOPAMIDOL (ISOVUE-300) INJECTION 61% COMPARISON:  None. FINDINGS: Lower chest: No acute abnormality.  Hepatobiliary: No focal liver abnormality is seen. No gallstones, gallbladder wall thickening, or biliary dilatation. Pancreas: Unremarkable. No pancreatic ductal dilatation or surrounding inflammatory changes. Spleen: Normal in size without focal abnormality. Adrenals/Urinary Tract: Adrenal glands are unremarkable. Kidneys are normal, without renal calculi, focal lesion, or hydronephrosis. Bladder is unremarkable. Stomach/Bowel: Stomach is within normal limits. Appendix appears normal. Moderate  amount of stool throughout the colon. No evidence of bowel wall thickening, distention, or inflammatory changes. Vascular/Lymphatic: Normal caliber abdominal aorta. No lymphadenopathy. Reproductive: Prostate is unremarkable. Other: Trace pelvic free fluid.  No abdominal wall hernia. Musculoskeletal: No lytic or sclerotic osseous lesion. Minimal grade 1 retrolisthesis of L5 on S1. No acute osseous abnormality. IMPRESSION: No acute abdominal or pelvic pathology. Electronically Signed   By: Elige Ko   On: 06/09/2016 13:05    Procedures Procedures (including critical care time)  Medications Ordered in ED Medications  ondansetron (ZOFRAN) injection 4 mg (4 mg Intravenous Given 06/09/16 1049)  sodium chloride 0.9 % bolus 1,000 mL (1,000 mLs Intravenous New Bag/Given 06/09/16 1049)  famotidine (PEPCID) IVPB 20 mg premix (0 mg Intravenous Stopped 06/09/16 1231)  iopamidol (ISOVUE-300) 61 % injection 30 mL (30 mLs Oral Contrast Given 06/09/16 1050)  iopamidol (ISOVUE-300) 61 % injection 100 mL (100 mLs Intravenous Contrast Given 06/09/16 1233)     Initial Impression / Assessment and Plan / ED Course  I have reviewed the triage vital signs and the nursing notes.  Pertinent labs & imaging results that were available during my care of the patient were reviewed by me and considered in my medical decision making (see chart for details).  Clinical Course     31 y.o. male here with lower abd pain intermittently x5 days, n/v x1 4 days ago with ongoing nausea but no vomiting since then; black stool (took pepto) but also having rectal pain and BRBPR. No diarrhea. Had [employer] prior to onset of symptoms so he wasn't sure if that was the cause. On exam, mild lower abd TTP, nonperitoneal, neg mcburney's; rectal exam with internal hemorrhoids that don't prolapse with valsalva, and mildly tender prostate. FOBT neg. CBC unremarkable, CMP lipase and U/A pending. Will add-on STD panel and obtain CT abd/pelv to r/o  prostatitis vs other etiology of rectal pain vs appendicitis (although doubtful). Will give pepcid and zofran and fluids and reassess shortly; pt declines wanting anything for pain at this time.  1:57 PM CMP WNL. Lipase WNL. U/A completely unremarkable; doubt need for empiric tx of GC/CT. CT abd/pelv without any acute findings, prostate unremarkable. It's possible that pt is simply having a food-related gastroenteritis type illness, and that caused the rectal pain, perhaps in part due to the internal hemorrhoids. Pt feeling better after zofran, fluids, and pepcid; tolerating PO well here. Will d/c home with anusol for internal hemorrhoids, zofran and zantac. Discussed bland diet today and tomorrow then advance as tolerated. Stay hydrated with fluids. F/up with [facility] in 1wk for recheck of symptoms and to establish medical care. Tylenol/motrin for pain. Also advised that lab will f/up on STD panel results, use condoms and abstain from sexual activity until he knows results. I explained the diagnosis and have given explicit precautions to return to the ER including for any other new or worsening symptoms. The patient understands and accepts the medical plan as it's been dictated and I have answered their questions. Discharge instructions concerning home care and prescriptions have been given. The patient is STABLE and is discharged to home in good condition.  Final Clinical Impressions(s) / ED Diagnoses   Final diagnoses:  Nausea and vomiting, intractability of vomiting not specified, unspecified vomiting type  Rectal pain  Internal hemorrhoid  Lower abdominal pain  Food poisoning, accidental or unintentional, initial encounter    New Prescriptions New Prescriptions   HYDROCORTISONE (ANUSOL-HC) 25 MG SUPPOSITORY    Place 1 suppository (25 mg total) rectally 2 (two) times daily. For 7 days   ONDANSETRON (ZOFRAN ODT) 4 MG DISINTEGRATING TABLET    Take 1 tablet (4 mg total) by mouth every 8 (eight)  hours as needed for nausea or vomiting.   RANITIDINE (ZANTAC) 150 MG TABLET    Take 1 tablet (150 mg total) by mouth 2 (two) times daily.     [id number] [place], PA-C 06/09/16 1357    Tilden Fossa, MD 06/09/16 Jerene Bears

## 2016-06-09 NOTE — Discharge Instructions (Signed)
Use zofran as prescribed, as needed for nausea. Use tylenol or motrin as needed for pain. Stay well hydrated with small sips of fluids throughout the day. Take zantac as directed. Use anusol as directed until completed, which should help with your rectal pain and bleeding. Eat a bland diet for the next 1-2 days then advance diet as tolerated. Avoid eating taco bell. Follow up with your regular doctor in 1 week for recheck of symptoms; if you don't have a regular doctor, then follow up with [facility] in 1 week for recheck of symptoms and to establish medical care. Return to ER for changing or worsening of symptoms.  You have been tested for gonorrhea, chlamydia, HIV, and Syphilis, and the hospital will call you if the lab is positive. DO NOT ENGAGE IN SEXUAL ACTIVITY UNTIL YOU FIND OUT ABOUT YOUR RESULTS AND HAVE PARTNERS TESTED AND TREATED. ALL PARTNERS MUST BE TESTED AND TREATED FOR STD'S. ALWAYS USE CONDOMS WHEN ENGAGING IN INTERCOURSE. [facility] for future STD concerns or screenings. This is the recommendation by the CDC for people with multiple sexual partners or history of STDs.

## 2016-06-09 NOTE — ED Triage Notes (Signed)
Per pt, states he ate at [employer] last Thursday-states he started having abdominal pain-states he threw up on Friday and felt better-states he had a BM yesterday and it was dark in color

## 2016-06-09 NOTE — ED Notes (Signed)
Patient d/c'd self care.  F/U and medications reviewed.  Patient verbalized understanding. 

## 2016-06-10 LAB — HIV ANTIBODY (ROUTINE TESTING W REFLEX): HIV Screen 4th Generation wRfx: NONREACTIVE

## 2016-06-10 LAB — RPR: RPR Ser Ql: NONREACTIVE

## 2017-01-04 ENCOUNTER — Emergency Department (HOSPITAL_COMMUNITY)
Admission: EM | Admit: 2017-01-04 | Discharge: 2017-01-04 | Disposition: A | Discharge status: Home / Self Care | Payer: Self-pay | Attending: Emergency Medicine | Admitting: Emergency Medicine

## 2017-01-04 ENCOUNTER — Encounter (HOSPITAL_COMMUNITY): Payer: Self-pay | Admitting: Emergency Medicine

## 2017-01-04 DIAGNOSIS — Z79899 Other long term (current) drug therapy: Secondary | ICD-10-CM | POA: Insufficient documentation

## 2017-01-04 DIAGNOSIS — Z87891 Personal history of nicotine dependence: Secondary | ICD-10-CM | POA: Insufficient documentation

## 2017-01-04 DIAGNOSIS — R300 Dysuria: Secondary | ICD-10-CM | POA: Insufficient documentation

## 2017-01-04 LAB — URINALYSIS, ROUTINE W REFLEX MICROSCOPIC
Bilirubin Urine: NEGATIVE
GLUCOSE, UA: NEGATIVE mg/dL
HGB URINE DIPSTICK: NEGATIVE
KETONES UR: NEGATIVE mg/dL
Leukocytes, UA: NEGATIVE
Nitrite: NEGATIVE
PH: 5 (ref 5.0–8.0)
PROTEIN: NEGATIVE mg/dL
Specific Gravity, Urine: 1.017 (ref 1.005–1.030)

## 2017-01-04 MED ORDER — CEFTRIAXONE SODIUM 250 MG IJ SOLR
250.0000 mg | Freq: Once | INTRAMUSCULAR | Status: AC
  Administered 2017-01-04: 250 mg via INTRAMUSCULAR
  Filled 2017-01-04: qty 250

## 2017-01-04 MED ORDER — STERILE WATER FOR INJECTION IJ SOLN
INTRAMUSCULAR | Status: AC
  Administered 2017-01-04: 0.9 mL
  Filled 2017-01-04: qty 10

## 2017-01-04 MED ORDER — AZITHROMYCIN 250 MG PO TABS
1000.0000 mg | ORAL_TABLET | Freq: Once | ORAL | Status: AC
  Administered 2017-01-04: 1000 mg via ORAL
  Filled 2017-01-04: qty 4

## 2017-01-04 NOTE — ED Triage Notes (Signed)
Patient presents with wife c/o discomfort to his penis. Reports burning with urination for past few days.

## 2017-01-04 NOTE — Discharge Instructions (Signed)
You have been evaluated for your urinary discomfort.  You have been tested for potential sexually transmitted infection.  You will be notify in the next few days if you tested positive for infection.

## 2017-01-04 NOTE — ED Provider Notes (Signed)
[facility] Provider Note   CSN: [id number] Arrival date & time: 01/04/17  2047     History   Chief Complaint Chief Complaint  Patient presents with  . Dysuria    HPI Kenneth Tanner is a 31 y.o. male.  HPI   31 year old male presenting for evaluation of urinary discomfort. For the past 2-3 days patient has notice mild urinary discomfort when urinating. No increased urination, back pain, abdominal pain, testicular pain, scrotal swelling, new rash, fever chills nausea vomiting or diarrhea. No penile discharge or bleeding. Remote history of gonorrhea and Chlamydia. No new sexual partner. No specific treatment tried.  Past Medical History:  Diagnosis Date  . Chlamydia   . Shoulder injury     There are no active problems to display for this patient.   History reviewed. No pertinent surgical history.     Home Medications    Prior to Admission medications   Medication Sig Start Date End Date Taking? Authorizing Provider  hydrocortisone (ANUSOL-HC) 25 MG suppository Place 1 suppository (25 mg total) rectally 2 (two) times daily. For 7 days 06/09/16   Street, [place], PA-C  naproxen sodium (ANAPROX) 220 MG tablet Take 440 mg by mouth 2 (two) times daily with a meal.    [provider]  omeprazole (PRILOSEC) 20 MG capsule Take 1 capsule (20 mg total) by mouth daily. Patient not taking: Reported on 06/09/2016 02/18/16   Antony MaduraHumes, Kelly, PA-C  ondansetron (ZOFRAN ODT) 4 MG disintegrating tablet Take 1 tablet (4 mg total) by mouth every 8 (eight) hours as needed for nausea or vomiting. 06/09/16   Street, Mercedes, PA-C  ranitidine (ZANTAC) 150 MG tablet Take 1 tablet (150 mg total) by mouth 2 (two) times daily. 06/09/16   Street, [place], PA-C    Family History No family history on file.  Social History Social History  Substance Use Topics  . Smoking status: Former Smoker    Quit date: 04/30/2013  . Smokeless tobacco: Former NeurosurgeonUser  . Alcohol use No     Allergies    Patient has no known allergies.   Review of Systems Review of Systems  All other systems reviewed and are negative.    Physical Exam Updated Vital Signs BP (!) 148/90 (BP Location: Left Arm)   Pulse 60   Temp 98.4 F (36.9 C) (Oral)   Resp 18   Ht 5\' 11"  (1.803 m)   Wt 104.3 kg (230 lb)   SpO2 100%   BMI 32.08 kg/m   Physical Exam  Constitutional: He appears well-developed and well-nourished. No distress.  HENT:  Head: Atraumatic.  Eyes: Conjunctivae are normal.  Neck: Neck supple.  Abdominal: Hernia confirmed negative in the right inguinal area and confirmed negative in the left inguinal area.  Genitourinary: Testes normal. Cremasteric reflex is present. Right testis shows no swelling and no tenderness. Left testis shows no swelling and no tenderness. Circumcised. No penile tenderness. No discharge found.  Genitourinary Comments: Chaperone present during exam.  Lymphadenopathy: No inguinal adenopathy noted on the right or left side.  Neurological: He is alert.  Skin: No rash noted.  Psychiatric: He has a normal mood and affect.  Nursing note and vitals reviewed.    ED Treatments / Results  Labs (all labs ordered are listed, but only abnormal results are displayed) Labs Reviewed  URINALYSIS, ROUTINE W REFLEX MICROSCOPIC  RPR  HIV ANTIBODY (ROUTINE TESTING)  GC/CHLAMYDIA PROBE AMP ([place]) NOT AT [facility]    EKG  EKG Interpretation None  Radiology No results found.  Procedures Procedures (including critical care time)  Medications Ordered in ED Medications - No data to display   Initial Impression / Assessment and Plan / ED Course  I have reviewed the triage vital signs and the nursing notes.  Pertinent labs & imaging results that were available during my care of the patient were reviewed by me and considered in my medical decision making (see chart for details).     BP (!) 148/90 (BP Location: Left Arm)   Pulse 60   Temp 98.4 F (36.9  C) (Oral)   Resp 18   Ht 5\' 11"  (1.803 m)   Wt 104.3 kg (230 lb)   SpO2 100%   BMI 32.08 kg/m    Final Clinical Impressions(s) / ED Diagnoses   Final diagnoses:  Dysuria    New Prescriptions New Prescriptions   No medications on file   10:32 PM Pt here with urinary discomfort and STD screening.  Exam unremarkable.  Rocephin/zithromax given. Stable for discharge.    Fayrene Helperran, Simmie Camerer, PA-C 01/04/17 2232    Mancel BaleWentz, Elliott, MD 01/04/17 [phone]

## 2017-01-05 LAB — GC/CHLAMYDIA PROBE AMP (~~LOC~~) NOT AT ARMC
Chlamydia: NEGATIVE
Neisseria Gonorrhea: NEGATIVE

## 2017-01-05 LAB — HIV ANTIBODY (ROUTINE TESTING W REFLEX): HIV SCREEN 4TH GENERATION: NONREACTIVE

## 2017-01-05 LAB — RPR: RPR: NONREACTIVE

## 2017-02-27 ENCOUNTER — Emergency Department (HOSPITAL_COMMUNITY)
Admission: EM | Admit: 2017-02-27 | Discharge: 2017-02-28 | Disposition: A | Discharge status: Home / Self Care | Payer: Self-pay | Attending: Emergency Medicine | Admitting: Emergency Medicine

## 2017-02-27 ENCOUNTER — Encounter (HOSPITAL_COMMUNITY): Payer: Self-pay | Admitting: Emergency Medicine

## 2017-02-27 DIAGNOSIS — L739 Follicular disorder, unspecified: Secondary | ICD-10-CM | POA: Insufficient documentation

## 2017-02-27 DIAGNOSIS — Z87891 Personal history of nicotine dependence: Secondary | ICD-10-CM | POA: Insufficient documentation

## 2017-02-27 DIAGNOSIS — Z79899 Other long term (current) drug therapy: Secondary | ICD-10-CM | POA: Insufficient documentation

## 2017-02-27 LAB — URINALYSIS, ROUTINE W REFLEX MICROSCOPIC
BILIRUBIN URINE: NEGATIVE
GLUCOSE, UA: NEGATIVE mg/dL
HGB URINE DIPSTICK: NEGATIVE
Ketones, ur: NEGATIVE mg/dL
Leukocytes, UA: NEGATIVE
Nitrite: NEGATIVE
PH: 6 (ref 5.0–8.0)
Protein, ur: NEGATIVE mg/dL
SPECIFIC GRAVITY, URINE: 1.02 (ref 1.005–1.030)

## 2017-02-27 NOTE — ED Provider Notes (Signed)
[facility] Provider Note   CSN: [id number] Arrival date & time: 02/27/17  1924     History   Chief Complaint Chief Complaint  Patient presents with  . Groin Pain    HPI Kenneth Tanner is a 31 y.o. male who presents to the ED for a rash on his penis. The rash started a few days ago and the patient states he wants to be sure he does not have an STI. He denies d/c from his penis. He denies pain in his testicles or groin area. He has had Chlamydia in the past.   HPI  Past Medical History:  Diagnosis Date  . Chlamydia   . Shoulder injury     There are no active problems to display for this patient.   History reviewed. No pertinent surgical history.     Home Medications    Prior to Admission medications   Medication Sig Start Date End Date Taking? Authorizing Provider  hydrocortisone (ANUSOL-HC) 25 MG suppository Place 1 suppository (25 mg total) rectally 2 (two) times daily. For 7 days 06/09/16   Street, [place], PA-C  naproxen sodium (ANAPROX) 220 MG tablet Take 440 mg by mouth 2 (two) times daily with a meal.    [provider]  omeprazole (PRILOSEC) 20 MG capsule Take 1 capsule (20 mg total) by mouth daily. Patient not taking: Reported on 06/09/2016 02/18/16   Antony Madura, PA-C  ondansetron (ZOFRAN ODT) 4 MG disintegrating tablet Take 1 tablet (4 mg total) by mouth every 8 (eight) hours as needed for nausea or vomiting. 06/09/16   Street, Mercedes, PA-C  ranitidine (ZANTAC) 150 MG tablet Take 1 tablet (150 mg total) by mouth 2 (two) times daily. 06/09/16   Street, Cora, PA-C    Family History History reviewed. No pertinent family history.  Social History Social History  Substance Use Topics  . Smoking status: Former Smoker    Quit date: 04/30/2013  . Smokeless tobacco: Former Neurosurgeon  . Alcohol use No     Allergies   Patient has no known allergies.   Review of Systems Review of Systems  Constitutional: Negative for chills and fever.  HENT:  Negative.   Genitourinary: Negative for discharge, dysuria, frequency, penile pain, penile swelling and urgency. Genital sores: rash.  Skin: Positive for rash.     Physical Exam Updated Vital Signs BP 124/81 (BP Location: Left Arm)   Pulse 73   Temp 98.7 F (37.1 C) (Oral)   Resp 18   Ht  (1.803 m)   Wt 113.4 kg (250 lb)   SpO2 97%   BMI 34.87 kg/m   Physical Exam  Constitutional: He appears well-developed and well-nourished. No distress.  HENT:  Head: Normocephalic.  Eyes: EOM are normal.  Neck: Neck supple.  Cardiovascular: Normal rate.   Pulmonary/Chest: Effort normal.  Abdominal: Soft. There is no tenderness.  Genitourinary: Uncircumcised. No penile erythema or penile tenderness. No discharge found.  Genitourinary Comments: There a few raised area to the shaft of the penis that appear as folliculitis.   Musculoskeletal: Normal range of motion.  Lymphadenopathy: No inguinal adenopathy noted on the right or left side.  Neurological: He is alert.  Skin: Skin is warm and dry.  Psychiatric: He has a normal mood and affect.  Nursing note and vitals reviewed.    ED Treatments / Results  Labs (all labs ordered are listed, but only abnormal results are displayed) Labs Reviewed  URINALYSIS, ROUTINE W REFLEX MICROSCOPIC  RPR  HIV ANTIBODY (  ROUTINE TESTING)  GC/CHLAMYDIA PROBE AMP ([place]) NOT AT [facility]     Radiology No results found.  Procedures Procedures (including critical care time)  Medications Ordered in ED Medications - No data to display   Initial Impression / Assessment and Plan / ED Course  I have reviewed the triage vital signs and the nursing notes.  Pt presents with concerns for possible STD.  Pt understands that they have GC/Chlamydia cultures pending and that they will need to inform all sexual partners if results return positive. Pt will await cultures since he has no symptoms at this time. Patient to be discharged with instructions to  follow up with [facility] or dermatology if symptoms persist.  Discussed importance of using protection when sexually active.  Final Clinical Impressions(s) / ED Diagnoses   Final diagnoses:  Folliculitis    New Prescriptions New Prescriptions   No medications on file     Janne Napoleon, NP 02/27/17 2357    Lorre Nick, MD 03/04/17 1130

## 2017-02-27 NOTE — ED Triage Notes (Addendum)
Patient c/o penis and testicle itching since this morning and testicle pain since 1600. Denies penile discharge.

## 2017-02-28 LAB — RPR: RPR Ser Ql: NONREACTIVE

## 2017-02-28 LAB — HIV ANTIBODY (ROUTINE TESTING W REFLEX): HIV Screen 4th Generation wRfx: NONREACTIVE

## 2017-02-28 NOTE — Discharge Instructions (Signed)
Use neosporin ointment on the areas. Follow up with the health department for STD concerns Follow up with Dr. Margo Aye dermatology if rash worsens.

## 2018-11-01 ENCOUNTER — Ambulatory Visit (HOSPITAL_COMMUNITY)
Admission: EM | Admit: 2018-11-01 | Discharge: 2018-11-01 | Disposition: A | Discharge status: Home / Self Care | Payer: Self-pay | Attending: Family Medicine | Admitting: Family Medicine

## 2018-11-01 ENCOUNTER — Encounter (HOSPITAL_COMMUNITY): Payer: Self-pay

## 2018-11-01 ENCOUNTER — Other Ambulatory Visit: Payer: Self-pay

## 2018-11-01 DIAGNOSIS — S39012A Strain of muscle, fascia and tendon of lower back, initial encounter: Secondary | ICD-10-CM

## 2018-11-01 NOTE — ED Triage Notes (Signed)
Patient presents to Urgent Care with complaints of lower back pain, acute exacerbation on chronic. Patient reports he is unsure the cause of his back pain but has been sitting more than normal.

## 2018-11-01 NOTE — Discharge Instructions (Signed)
Most likely you have strained a muscle in your back.  You can take ibuprofen for the pain Heat/massage/stretch. Follow up as needed for continued or worsening symptoms

## 2018-11-01 NOTE — ED Provider Notes (Signed)
[facility]    CSN: [id number] Arrival date & time: 11/01/18  4627     History   Chief Complaint Chief Complaint  Patient presents with  . Back Pain    HPI Kenneth Tanner is a 33 y.o. male.   Patient is a 33 year old male with the presents today with lower back pain.  This is been waxing and waning over the past week.  Started after picking up a heavy box.  Felt a pull in his back.  Since symptoms have somewhat improved.  He has been taking over-the-counter pain medication with some relief.  Denies any associated injuries, fever, chills, urinary symptoms.  No fever.  No numbness, tingling or weakness.  ROS per HPI      Past Medical History:  Diagnosis Date  . Chlamydia   . Shoulder injury     There are no active problems to display for this patient.   History reviewed. No pertinent surgical history.     Home Medications    Prior to Admission medications   Medication Sig Start Date End Date Taking? Authorizing Provider  omeprazole (PRILOSEC) 20 MG capsule Take 1 capsule (20 mg total) by mouth daily. Patient not taking: Reported on 06/09/2016 02/18/16   Antony Madura, PA-C    Family History Family History  Problem Relation Age of Onset  . Healthy Mother   . Healthy Father     Social History Social History   Tobacco Use  . Smoking status: Former Smoker    Last attempt to quit: 04/30/2013    Years since quitting: 5.5  . Smokeless tobacco: Former Engineer, water Use Topics  . Alcohol use: No  . Drug use: No     Allergies   Patient has no known allergies.   Review of Systems Review of Systems   Physical Exam Triage Vital Signs ED Triage Vitals  Enc Vitals Group     BP 11/01/18 0857 134/89     Pulse Rate 11/01/18 0857 79     Resp 11/01/18 0857 18     Temp 11/01/18 0857 98.6 F (37 C)     Temp Source 11/01/18 0857 Oral     SpO2 11/01/18 0857 99 %     Weight --      Height --      Head Circumference --      Peak Flow --    Pain Score 11/01/18 0854 6     Pain Loc --      Pain Edu? --      Excl. in GC? --    No data found.  Updated Vital Signs BP 134/89 (BP Location: Right Arm)   Pulse 79   Temp 98.6 F (37 C) (Oral)   Resp 18   SpO2 99%   Visual Acuity Right Eye Distance:   Left Eye Distance:   Bilateral Distance:    Right Eye Near:   Left Eye Near:    Bilateral Near:     Physical Exam Vitals signs and nursing note reviewed.  Constitutional:      Appearance: Normal appearance.  HENT:     Head: Normocephalic and atraumatic.     Nose: Nose normal.  Eyes:     Conjunctiva/sclera: Conjunctivae normal.  Neck:     Musculoskeletal: Normal range of motion.  Pulmonary:     Effort: Pulmonary effort is normal.  Musculoskeletal: Normal range of motion.     Comments: Nontender to palpation of bony spine and paravertebral musculature. No  bruising, swelling or deformities.  No rashes or erythema. Good range of motion.  Skin:    General: Skin is warm and dry.  Neurological:     General: No focal deficit present.     Mental Status: He is alert.  Psychiatric:        Mood and Affect: Mood normal.      UC Treatments / Results  Labs (all labs ordered are listed, but only abnormal results are displayed) Labs Reviewed - No data to display  EKG None  Radiology No results found.  Procedures Procedures (including critical care time)  Medications Ordered in UC Medications - No data to display  Initial Impression / Assessment and Plan / UC Course  I have reviewed the triage vital signs and the nursing notes.  Pertinent labs & imaging results that were available during my care of the patient were reviewed by me and considered in my medical decision making (see chart for details).     Most likely patient has a pulled muscle in the lower back  that is improving.  He can take ibuprofen as needed for pain.  Gentle stretching, heat, massage Work note given as requested Final Clinical  Impressions(s) / UC Diagnoses   Final diagnoses:  Strain of lumbar region, initial encounter     Discharge Instructions     Most likely you have strained a muscle in your back.  You can take ibuprofen for the pain Heat/massage/stretch. Follow up as needed for continued or worsening symptoms     ED Prescriptions    None     Controlled Substance Prescriptions [place] Controlled Substance Registry consulted? Not Applicable   Janace ArisBast, Manas Hickling A, NP 11/01/18 [phone]

## 2018-12-08 ENCOUNTER — Ambulatory Visit (INDEPENDENT_AMBULATORY_CARE_PROVIDER_SITE_OTHER): Payer: Self-pay

## 2018-12-08 ENCOUNTER — Encounter (HOSPITAL_COMMUNITY): Payer: Self-pay

## 2018-12-08 ENCOUNTER — Ambulatory Visit (HOSPITAL_COMMUNITY)
Admission: EM | Admit: 2018-12-08 | Discharge: 2018-12-08 | Disposition: A | Discharge status: Home / Self Care | Payer: Self-pay | Attending: Internal Medicine | Admitting: Internal Medicine

## 2018-12-08 ENCOUNTER — Other Ambulatory Visit: Payer: Self-pay

## 2018-12-08 DIAGNOSIS — S93491A Sprain of other ligament of right ankle, initial encounter: Secondary | ICD-10-CM

## 2018-12-08 MED ORDER — KETOROLAC TROMETHAMINE 30 MG/ML IJ SOLN
INTRAMUSCULAR | Status: AC
  Filled 2018-12-08: qty 1

## 2018-12-08 MED ORDER — NAPROXEN 375 MG PO TABS: 375.0000 mg | ORAL_TABLET | Freq: Two times a day (BID) | ORAL | 0 refills | Status: DC

## 2018-12-08 MED ORDER — KETOROLAC TROMETHAMINE 30 MG/ML IJ SOLN
30.0000 mg | Freq: Once | INTRAMUSCULAR | Status: AC
  Administered 2018-12-08: 30 mg via INTRAMUSCULAR

## 2018-12-08 NOTE — ED Provider Notes (Signed)
[facility]    CSN: [id number] Arrival date & time: 12/08/18  1230      History   Chief Complaint Chief Complaint  Patient presents with  . Foot Pain    HPI Kenneth Tanner is a 33 y.o. male comes to urgent care with right ankle pain of 2 days duration.  Pain started after patient had a pop in his ankle.  Pain is constant, severe-10/10.   Patient cannot bear weight on that ankle.  No relieving factors.  No numbness or tingling.  No bruising on the ankle.  No trauma.  Patient has hurt his ankle 2 years ago was playing basketball but did not seek any medical care for that.  HPI  Past Medical History:  Diagnosis Date  . Chlamydia   . Shoulder injury     There are no active problems to display for this patient.   History reviewed. No pertinent surgical history.     Home Medications    Prior to Admission medications   Medication Sig Start Date End Date Taking? Authorizing Provider  omeprazole (PRILOSEC) 20 MG capsule Take 1 capsule (20 mg total) by mouth daily. Patient not taking: Reported on 06/09/2016 02/18/16   Antony MaduraHumes, Kelly, PA-C    Family History Family History  Problem Relation Age of Onset  . Healthy Mother   . Healthy Father     Social History Social History   Tobacco Use  . Smoking status: Former Smoker    Quit date: 04/30/2013    Years since quitting: 5.6  . Smokeless tobacco: Former Engineer, waterUser  Substance Use Topics  . Alcohol use: No  . Drug use: No     Allergies   Patient has no known allergies.   Review of Systems Review of Systems  HENT: Negative.   Respiratory: Negative.   Gastrointestinal: Negative.   Genitourinary: Negative.   Musculoskeletal: Positive for arthralgias. Negative for back pain, gait problem, joint swelling, myalgias, neck pain and neck stiffness.  Skin: Negative.      Physical Exam Triage Vital Signs ED Triage Vitals  Enc Vitals Group     BP 12/08/18 1255 119/78     Pulse Rate 12/08/18 1255 82     Resp  12/08/18 1255 16     Temp 12/08/18 1255 98 F (36.7 C)     Temp Source 12/08/18 1255 Oral     SpO2 12/08/18 1255 100 %     Weight --      Height --      Head Circumference --      Peak Flow --      Pain Score 12/08/18 1253 10     Pain Loc --      Pain Edu? --      Excl. in GC? --    No data found.  Updated Vital Signs BP 119/78 (BP Location: Right Arm)   Pulse 82   Temp 98 F (36.7 C) (Oral)   Resp 16   SpO2 100%   Visual Acuity Right Eye Distance:   Left Eye Distance:   Bilateral Distance:    Right Eye Near:   Left Eye Near:    Bilateral Near:     Physical Exam Constitutional:      General: He is in acute distress.     Appearance: Normal appearance. He is not ill-appearing, toxic-appearing or diaphoretic.  Neck:     Musculoskeletal: Normal range of motion.  Cardiovascular:     Rate and Rhythm: Normal rate  and regular rhythm.  Pulmonary:     Effort: Pulmonary effort is normal.     Breath sounds: Normal breath sounds.  Musculoskeletal:        General: Tenderness present.     Comments: Tenderness to palpation over the medial malleolus.  No swelling of the ankle.  No bruising.  Skin:    General: Skin is warm.     Capillary Refill: Capillary refill takes less than 2 seconds.  Neurological:     General: No focal deficit present.     Mental Status: He is alert.      UC Treatments / Results  Labs (all labs ordered are listed, but only abnormal results are displayed) Labs Reviewed - No data to display  EKG   Radiology No results found.  Procedures Procedures (including critical care time)  Medications Ordered in UC Medications - No data to display  Initial Impression / Assessment and Plan / UC Course  I have reviewed the triage vital signs and the nursing notes.  Pertinent labs & imaging results that were available during my care of the patient were reviewed by me and considered in my medical decision making (see chart for details).     1.   Right ankle pain: X-ray of the right ankle is negative for acute fracture.  This was independently reviewed by me We will wait for the final read Toradol 30 mg IM Ace wrap R ICE Naproxen 375 mg twice daily as needed Patient had a question about a work note to restrict his duties at work.  I informed him that he would need to see occupational health to get a detailed recommendation of what he can and cannot do.   Final Clinical Impressions(s) / UC Diagnoses   Final diagnoses:  None   Discharge Instructions   None    ED Prescriptions    None     Controlled Substance Prescriptions [place] Controlled Substance Registry consulted? No   Chase Picket, MD 12/08/18 1409

## 2018-12-08 NOTE — ED Triage Notes (Signed)
Patient presents to Urgent Care with complaints of right foot pain since feeling a "pop" in his foot while walking 2 days ago. Patient reports he has a restrictions form from work to be filled out by a provider.

## 2019-06-11 ENCOUNTER — Encounter (HOSPITAL_COMMUNITY): Payer: Self-pay | Admitting: Emergency Medicine

## 2019-06-11 ENCOUNTER — Other Ambulatory Visit: Payer: Self-pay

## 2019-06-11 ENCOUNTER — Emergency Department (HOSPITAL_COMMUNITY)
Admission: EM | Admit: 2019-06-11 | Discharge: 2019-06-11 | Disposition: A | Discharge status: Home / Self Care | Payer: Self-pay | Attending: Emergency Medicine | Admitting: Emergency Medicine

## 2019-06-11 DIAGNOSIS — U071 COVID-19: Secondary | ICD-10-CM | POA: Insufficient documentation

## 2019-06-11 DIAGNOSIS — Z87891 Personal history of nicotine dependence: Secondary | ICD-10-CM | POA: Insufficient documentation

## 2019-06-11 LAB — POC SARS CORONAVIRUS 2 AG -  ED: SARS Coronavirus 2 Ag: POSITIVE — AB

## 2019-06-11 MED ORDER — ONDANSETRON 4 MG PO TBDP: 4.0000 mg | ORAL_TABLET | Freq: Three times a day (TID) | ORAL | 0 refills | Status: DC | PRN

## 2019-06-11 MED ORDER — ALBUTEROL SULFATE HFA 108 (90 BASE) MCG/ACT IN AERS: 1.0000 | INHALATION_SPRAY | Freq: Four times a day (QID) | RESPIRATORY_TRACT | 0 refills | Status: DC | PRN

## 2019-06-11 NOTE — ED Provider Notes (Signed)
[facility] EMERGENCY DEPARTMENT Provider Note   CSN: [id number] Arrival date & time: 06/11/19  6237     History Chief Complaint  Patient presents with  . Covid+/fever  . Fever  . Generalized Body Aches    Kenneth Tanner is a 34 y.o. male.  HPI      Kenneth Tanner is a 34 y.o. male, patient with no pertinent past medical history, presenting to the ED with fever for the last 2 days.  Also notes intermittent headaches, nasal congestion, fatigue, body aches, occasional nausea, and intermittent sore throat. He has been taking Tylenol for fever with successful reduction in his fever.  Denies cough, shortness of breath, chest pain, abdominal pain, vomiting, diarrhea, rash, difficulty swallowing, or any other complaints.   Past Medical History:  Diagnosis Date  . Chlamydia   . Shoulder injury     There are no problems to display for this patient.   History reviewed. No pertinent surgical history.     Family History  Problem Relation Age of Onset  . Healthy Mother   . Healthy Father     Social History   Tobacco Use  . Smoking status: Former Smoker    Quit date: 04/30/2013    Years since quitting: 6.1  . Smokeless tobacco: Former Network engineer Use Topics  . Alcohol use: No  . Drug use: No    Home Medications Prior to Admission medications   Medication Sig Start Date End Date Taking? Authorizing Provider  albuterol (VENTOLIN HFA) 108 (90 Base) MCG/ACT inhaler Inhale 1-2 puffs into the lungs every 6 (six) hours as needed for wheezing or shortness of breath. 06/11/19   Arnelle Nale C, PA-C  naproxen (NAPROSYN) 375 MG tablet Take 1 tablet (375 mg total) by mouth 2 (two) times daily. 12/08/18   Lamptey, Myrene Galas, MD  ondansetron (ZOFRAN ODT) 4 MG disintegrating tablet Take 1 tablet (4 mg total) by mouth every 8 (eight) hours as needed for nausea or vomiting. 06/11/19   Talitha Dicarlo C, PA-C  omeprazole (PRILOSEC) 20 MG capsule Take 1 capsule (20 mg total) by  mouth daily. Patient not taking: Reported on 06/09/2016 02/18/16 12/08/18  Antonietta Breach, PA-C    Allergies    Patient has no known allergies.  Review of Systems   Review of Systems  Constitutional: Positive for fatigue and fever.  HENT: Positive for congestion and sore throat. Negative for sinus pain, trouble swallowing and voice change.   Respiratory: Negative for cough and shortness of breath.   Cardiovascular: Negative for chest pain.  Gastrointestinal: Positive for nausea. Negative for abdominal pain and vomiting.  Musculoskeletal: Positive for myalgias. Negative for neck pain and neck stiffness.  Skin: Negative for rash.  Neurological: Positive for headaches. Negative for dizziness, syncope and weakness.  All other systems reviewed and are negative.   Physical Exam Updated Vital Signs BP 130/82 (BP Location: Right Arm)   Pulse 77   Temp 100 F (37.8 C) (Oral)   Resp 18   SpO2 96%   Physical Exam Vitals and nursing note reviewed.  Constitutional:      General: He is not in acute distress.    Appearance: He is well-developed. He is not diaphoretic.  HENT:     Head: Normocephalic and atraumatic.     Nose: Nose normal.     Right Sinus: No maxillary sinus tenderness or frontal sinus tenderness.     Left Sinus: No maxillary sinus tenderness or frontal sinus tenderness.  Mouth/Throat:     Mouth: Mucous membranes are moist.     Pharynx: Oropharynx is clear. No oropharyngeal exudate or posterior oropharyngeal erythema.  Eyes:     Conjunctiva/sclera: Conjunctivae normal.  Cardiovascular:     Rate and Rhythm: Normal rate and regular rhythm.     Pulses: Normal pulses.          Radial pulses are 2+ on the right side and 2+ on the left side.       Posterior tibial pulses are 2+ on the right side and 2+ on the left side.     Heart sounds: Normal heart sounds.     Comments: Tactile temperature in the extremities appropriate and equal bilaterally. Pulmonary:     Effort:  Pulmonary effort is normal. No respiratory distress.     Breath sounds: Normal breath sounds.     Comments: No increased work of breathing.  Speaks in full sentences without difficulty. Abdominal:     Palpations: Abdomen is soft.     Tenderness: There is no abdominal tenderness. There is no guarding.  Musculoskeletal:     Cervical back: Neck supple.     Right lower leg: No edema.     Left lower leg: No edema.  Lymphadenopathy:     Cervical: No cervical adenopathy.  Skin:    General: Skin is warm and dry.  Neurological:     Mental Status: He is alert.  Psychiatric:        Mood and Affect: Mood and affect normal.        Speech: Speech normal.        Behavior: Behavior normal.     ED Results / Procedures / Treatments   Labs (all labs ordered are listed, but only abnormal results are displayed) Labs Reviewed  POC SARS CORONAVIRUS 2 AG -  ED - Abnormal; Notable for the following components:      Result Value   SARS Coronavirus 2 Ag POSITIVE (*)    All other components within normal limits    EKG None  Radiology No results found.  Procedures Procedures (including critical care time)  Medications Ordered in ED Medications - No data to display  ED Course  I have reviewed the triage vital signs and the nursing notes.  Pertinent labs & imaging results that were available during my care of the patient were reviewed by me and considered in my medical decision making (see chart for details).    MDM Rules/Calculators/A&P                      Patient presents with fever as well as body aches, headache, occasional sore throat, nasal congestion. Patient is nontoxic appearing, afebrile here in the ED, not tachycardic, not tachypneic, not hypotensive, excellent SPO2 on room air, and is in no apparent distress.  Covid positive here in the ED.  Some medications were prescribed to help with the patient should he develop additional, common Covid related symptoms. The patient was  given instructions for home care as well as return precautions. Patient voices understanding of these instructions, accepts the plan, and is comfortable with discharge.  Final Clinical Impression(s) / ED Diagnoses Final diagnoses:  COVID-19    Rx / DC Orders ED Discharge Orders         Ordered    albuterol (VENTOLIN HFA) 108 (90 Base) MCG/ACT inhaler  Every 6 hours PRN     06/11/19 1120    ondansetron (ZOFRAN ODT) 4 MG  disintegrating tablet  Every 8 hours PRN     06/11/19 1120           Concepcion Living 06/11/19 1154    Raeford Razor, MD 06/11/19 1229

## 2019-06-11 NOTE — ED Triage Notes (Signed)
C/o fever, body aches, and chills since Friday night.  [place].  Reports + COVID contact last week.

## 2019-06-11 NOTE — Discharge Instructions (Signed)
COVID-19 isolation recommendations Your test was positive for COVID-19 infection today. Patients who have symptoms consistent with COVID-19 should self isolate until: At least 3 days (72 hours) have passed since recovery, defined as resolution of fever without the use of fever reducing medications and improvement in respiratory symptoms (e.g., cough, shortness of breath), and At least 7 days have passed since symptoms first appeared. Retesting is not required and not recommended as patients can continue to test positive for several weeks despite lack of symptoms.  COVID-19 is caused by a virus. Viruses do not require or respond to antibiotics. Treatment is symptomatic care and it is important to note that these symptoms may last for 7-14 days.   Hand washing: Wash your hands throughout the day, but especially before and after touching the face, using the restroom, sneezing, coughing, or touching surfaces that have been coughed or sneezed upon. Hydration: Symptoms of most illnesses will be intensified and complicated by dehydration. Dehydration can also extend the duration of symptoms. Drink plenty of fluids and get plenty of rest. You should be drinking at least half a liter of water an hour to stay hydrated. Electrolyte drinks (ex. Gatorade, Powerade, Pedialyte) are also encouraged. You should be drinking enough fluids to make your urine light yellow, almost clear. If this is not the case, you are not drinking enough water. Please note that some of the treatments indicated below will not be effective if you are not adequately hydrated. Diet: Please concentrate on hydration, however, you may introduce food slowly.  Start with a clear liquid diet, progressed to a full liquid diet, and then bland solids as you are able. Pain or fever: Ibuprofen, Naproxen, or acetaminophen (generic for Tylenol) for pain or fever.  Antiinflammatory medications: Take 600 mg of ibuprofen every 6 hours or 440 mg (over the  counter dose) to 500 mg (prescription dose) of naproxen every 12 hours for the next 3 days. After this time, these medications may be used as needed for pain. Take these medications with food to avoid upset stomach. Choose only one of these medications, do not take them together. Acetaminophen (generic for Tylenol): Should you continue to have additional pain while taking the ibuprofen or naproxen, you may add in acetaminophen as needed. Your daily total maximum amount of acetaminophen from all sources should be limited to 4000mg /day for persons without liver problems, or 2000mg /day for those with liver problems. Nausea/vomiting: Use the ondansetron (generic for Zofran) for nausea or vomiting.  This medication may not prevent all vomiting or nausea, but can help facilitate better hydration. Things that can help with nausea/vomiting also include peppermint/menthol candies, vitamin B12, and ginger. Cough: Use the benzonatate (generic for Tessalon) for cough.  Teas, warm liquids, broths, and honey can also help with cough. Albuterol: May use the albuterol as needed for instances of shortness of breath. Zyrtec or Claritin: May add these medication daily to control underlying symptoms of congestion, sneezing, and other signs of allergies.  These medications are available over-the-counter. Generics: Cetirizine (generic for Zyrtec) and loratadine (generic for Claritin). Fluticasone: Use fluticasone (generic for Flonase), as directed, for nasal and sinus congestion.  This medication is available over-the-counter. Congestion: Plain guaifenesin (generic for plain Mucinex) may help relieve congestion. Saline sinus rinses and saline nasal sprays may also help relieve congestion. If you do not have high blood pressure, heart problems, or an allergy to such medications, you may also try phenylephrine or Sudafed. Sore throat: Warm liquids or Chloraseptic spray may help soothe  a sore throat. Gargle twice a day with a  salt water solution made from a half teaspoon of salt in a cup of warm water.  Follow up: Follow up with a primary care provider within the next two weeks should symptoms fail to resolve. Return: Return to the ED for significantly worsening symptoms, shortness of breath, persistent vomiting, large amounts of blood in stool, or any other major concerns.  For prescription assistance, may try using prescription discount sites or apps, such as goodrx.com

## 2019-06-19 ENCOUNTER — Ambulatory Visit: Payer: Self-pay | Attending: Internal Medicine

## 2019-06-19 DIAGNOSIS — Z20822 Contact with and (suspected) exposure to covid-19: Secondary | ICD-10-CM | POA: Insufficient documentation

## 2019-06-21 LAB — NOVEL CORONAVIRUS, NAA: SARS-CoV-2, NAA: NOT DETECTED

## 2019-06-23 ENCOUNTER — Telehealth: Payer: Self-pay

## 2019-06-23 NOTE — Telephone Encounter (Signed)
Result faxed as requested. 

## 2019-06-23 NOTE — Telephone Encounter (Signed)
Pt call to have test results faxed to [phone] attention: Prince Rome   Sent request to nurse triage.

## 2019-09-21 ENCOUNTER — Emergency Department (HOSPITAL_COMMUNITY): Payer: Self-pay

## 2019-09-21 ENCOUNTER — Emergency Department (HOSPITAL_COMMUNITY)
Admission: EM | Admit: 2019-09-21 | Discharge: 2019-09-21 | Disposition: A | Discharge status: Home / Self Care | Payer: Self-pay | Attending: Emergency Medicine | Admitting: Emergency Medicine

## 2019-09-21 ENCOUNTER — Encounter (HOSPITAL_COMMUNITY): Payer: Self-pay

## 2019-09-21 ENCOUNTER — Other Ambulatory Visit: Payer: Self-pay

## 2019-09-21 DIAGNOSIS — Z87891 Personal history of nicotine dependence: Secondary | ICD-10-CM | POA: Insufficient documentation

## 2019-09-21 DIAGNOSIS — M25511 Pain in right shoulder: Secondary | ICD-10-CM | POA: Insufficient documentation

## 2019-09-21 NOTE — Discharge Instructions (Signed)
Recommend 600 mg of Motrin every 8 hours for the next 5 days, 1000 mg of Tylenol 4 times a day for the next 5 days and then as needed.  Recommend ice.  Use sling for comfort for moments when you will be on your feet for a long time however please remove sling for some range of motion exercises several times a day.

## 2019-09-21 NOTE — ED Triage Notes (Signed)
Patient reports playing basketball and someone dunked over his head and landed on his right shoulder yesterday.   9/10 pain   A/Ox4 Ambulatory in triage.

## 2019-09-21 NOTE — ED Provider Notes (Signed)
[facility] DEPT Provider Note   CSN: [id number] Arrival date & time: 09/21/19  1245     History Chief Complaint  Patient presents with  . Shoulder Pain    right    Kenneth Tanner is a 34 y.o. male.   Shoulder Pain Location:  Shoulder Shoulder location:  R shoulder Injury: yes   Pain details:    Quality:  Aching   Radiates to:  Does not radiate   Severity:  Mild   Timing:  Intermittent   Progression:  Waxing and waning Prior injury to area:  Unable to specify Relieved by:  None tried Worsened by:  Movement Associated symptoms: no back pain, no fever and no neck pain        Past Medical History:  Diagnosis Date  . Chlamydia   . Shoulder injury     There are no problems to display for this patient.   History reviewed. No pertinent surgical history.     Family History  Problem Relation Age of Onset  . Healthy Mother   . Healthy Father     Social History   Tobacco Use  . Smoking status: Former Smoker    Quit date: 04/30/2013    Years since quitting: 6.3  . Smokeless tobacco: Former Network engineer Use Topics  . Alcohol use: No  . Drug use: No    Home Medications Prior to Admission medications   Medication Sig Start Date End Date Taking? Authorizing Provider  albuterol (VENTOLIN HFA) 108 (90 Base) MCG/ACT inhaler Inhale 1-2 puffs into the lungs every 6 (six) hours as needed for wheezing or shortness of breath. 06/11/19   Joy, Shawn C, PA-C  naproxen (NAPROSYN) 375 MG tablet Take 1 tablet (375 mg total) by mouth 2 (two) times daily. 12/08/18   Lamptey, Myrene Galas, MD  ondansetron (ZOFRAN ODT) 4 MG disintegrating tablet Take 1 tablet (4 mg total) by mouth every 8 (eight) hours as needed for nausea or vomiting. 06/11/19   Joy, Shawn C, PA-C  omeprazole (PRILOSEC) 20 MG capsule Take 1 capsule (20 mg total) by mouth daily. Patient not taking: Reported on 06/09/2016 02/18/16 12/08/18  Antonietta Breach, PA-C    Allergies    Patient has no  known allergies.  Review of Systems   Review of Systems  Constitutional: Negative for fever.  Musculoskeletal: Positive for arthralgias. Negative for back pain, gait problem, joint swelling, myalgias, neck pain and neck stiffness.  Skin: Negative for color change, pallor, rash and wound.  Neurological: Negative for weakness and numbness.    Physical Exam Updated Vital Signs BP (!) 140/94 (BP Location: Left Arm)   Pulse (!) 101   Temp 98.5 F (36.9 C) (Oral)   Resp 16   Ht 5\' 11"  (1.803 m)   Wt 95.3 kg   SpO2 97%   BMI 29.29 kg/m   Physical Exam Constitutional:      General: He is not in acute distress.    Appearance: He is not ill-appearing.  Cardiovascular:     Pulses: Normal pulses.  Musculoskeletal:        General: Tenderness (right shoulder area) present. No swelling or deformity. Normal range of motion.  Skin:    General: Skin is warm.  Neurological:     Mental Status: He is alert.     Sensory: No sensory deficit.     Motor: No weakness.     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 No fracture. Shoulder joint normal. IMPRESSION: Chronic right AC separation.  No acute abnormality. Electronically Signed   By: Marlan Palau M.D.   On: 09/21/2019 13:29    Procedures Procedures (including critical care time)  Medications Ordered in ED Medications - No data to display  ED Course  I have reviewed the triage vital signs and the nursing notes.  Pertinent labs & imaging results that were available during my care of the patient were  reviewed by me and considered in my medical decision making (see chart for details).    MDM Rules/Calculators/A&P                      Kenneth Tanner is a 34 year old male with no significant medical history presents to the ED with right shoulder pain.  Patient with overall unremarkable vitals.  Pain after playing basketball and hitting his right shoulder.  Overall good range of motion of the right shoulder.  Good pulses.  Tenderness over the clavicular area/AC joint area.  Overall normal strength and sensation to the right upper extremity.  X-ray of the right shoulder showed chronic right AC separation.  He was not aware of any prior injuries.  Suspect that he has exacerbated this issue.  We will place him in a sling.  Recommend Tylenol, Motrin, ice, rest.  Will refer him to sports medicine if he needs physical therapy or other further imaging.  Discharged in good condition.  Return precautions given.  This chart was dictated using voice recognition software.  Despite best efforts to proofread,  errors can occur which can change the documentation meaning.   Final Clinical Impression(s) / ED Diagnoses Final diagnoses:  Acute pain of right shoulder    Rx / DC Orders ED Discharge Orders    None       Virgina Norfolk, DO 09/21/19 1345

## 2019-09-23 NOTE — Progress Notes (Signed)
Orthopedic Tech Progress Note Patient Details:  Kenneth Tanner [birth year] [id number]  Ortho Devices Type of Ortho Device: Shoulder immobilizer Ortho Device/Splint Location: Charging fore ED Supplies       Saul Fordyce 09/23/2019, 4:28 PM

## 2021-04-21 IMAGING — DX RIGHT ANKLE - COMPLETE 3+ VIEW
3 series · 3 of 3 positions shown · non-contrast
Comparison: None.

CLINICAL DATA: Right ankle pain for 2 days.  No injury.

EXAM:
RIGHT ANKLE - COMPLETE 3+ VIEW

[ankle ap]
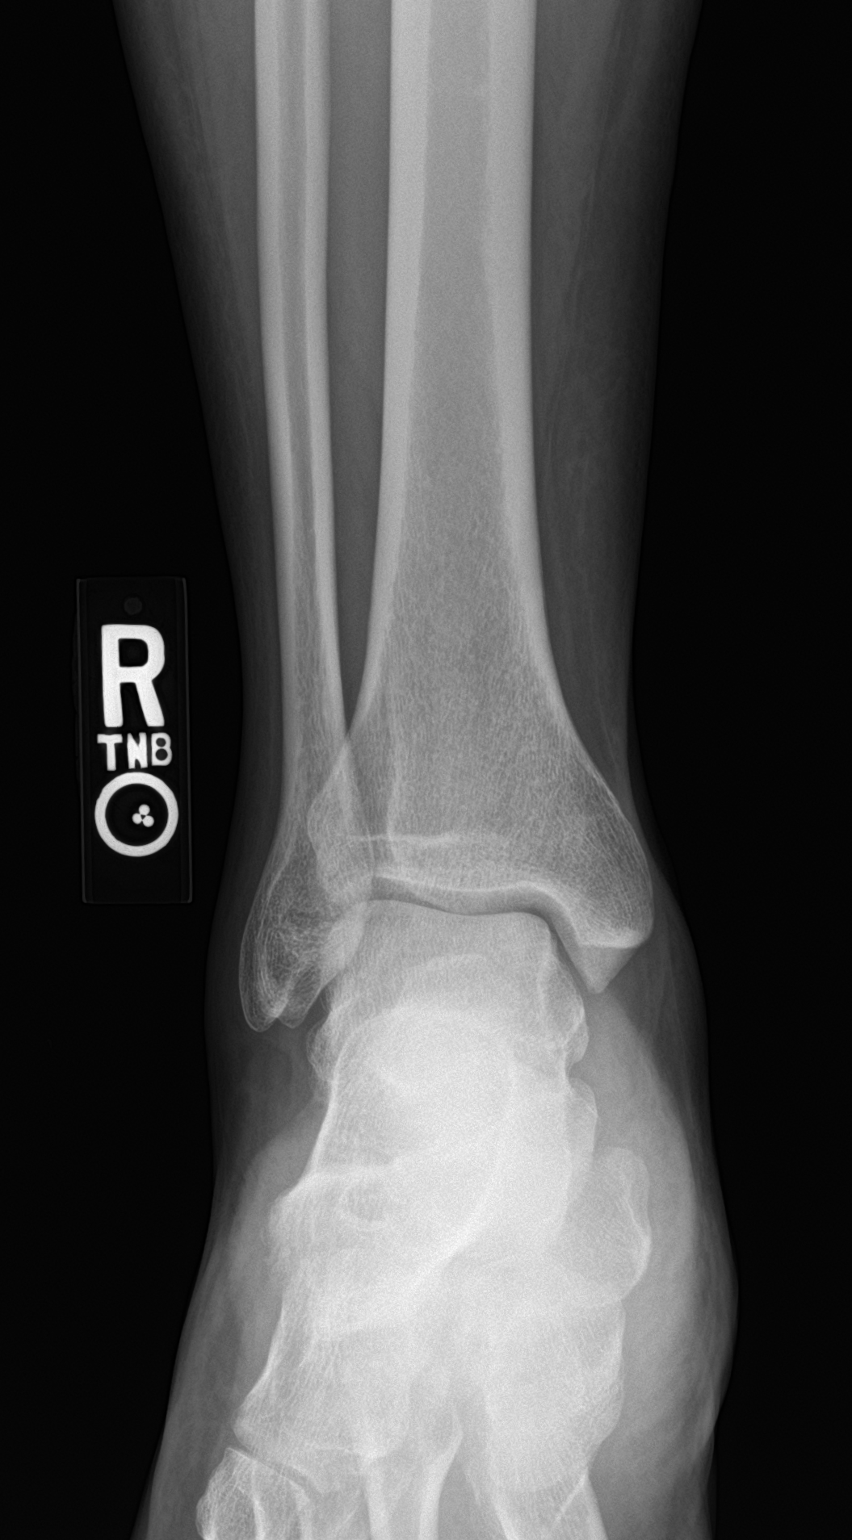

[ankle obl]
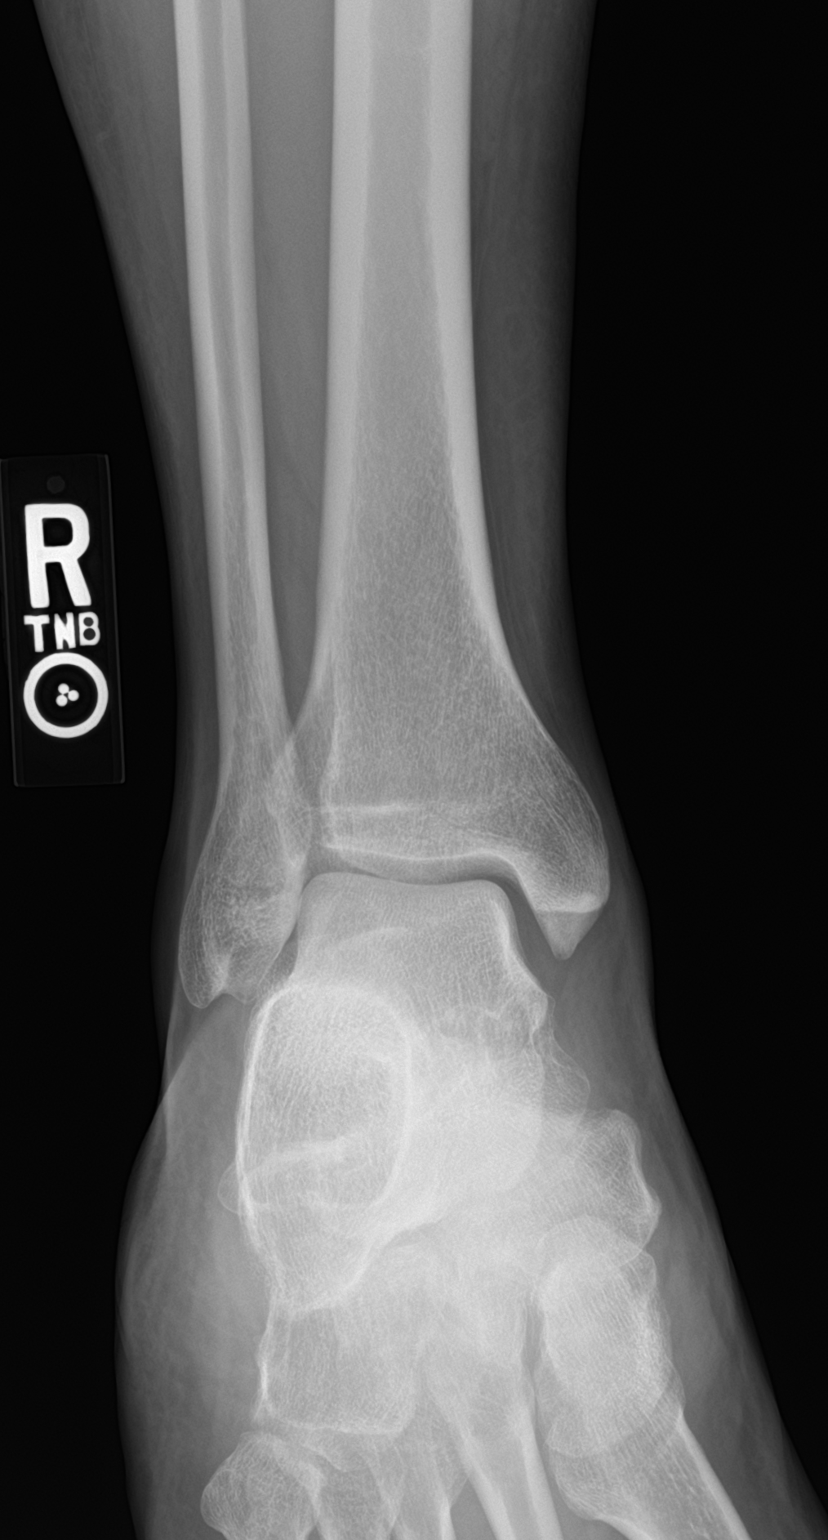

[ankle lat]
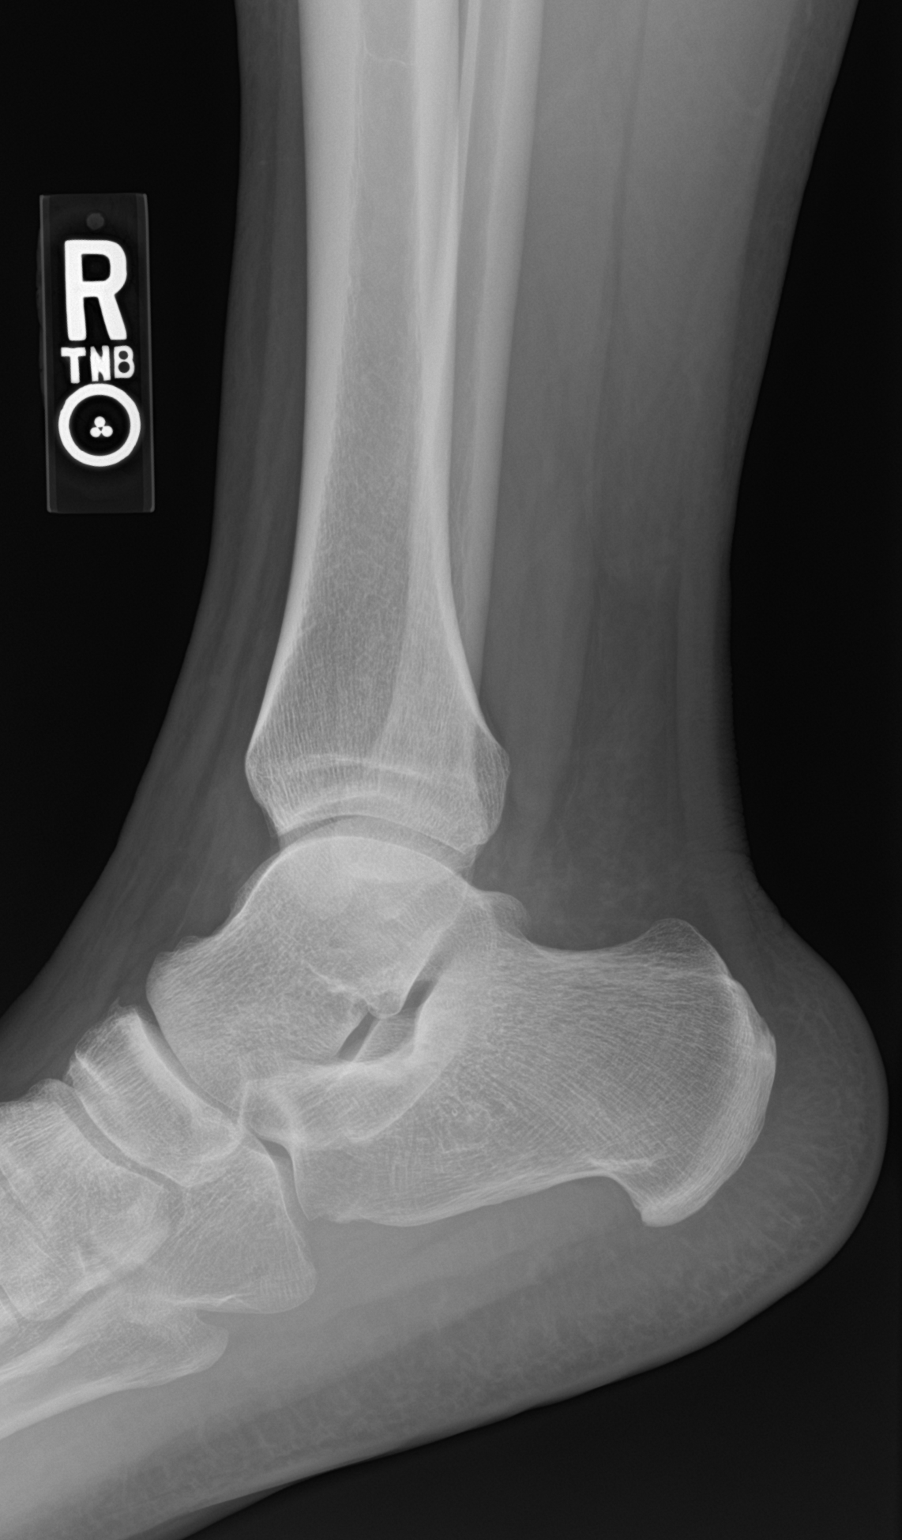

[3 of 3 positions shown; findings below may reference images not displayed]

FINDINGS: There is no evidence of fracture, dislocation, or joint effusion.
There is no evidence of arthropathy or other focal bone abnormality.
Soft tissues are unremarkable.
IMPRESSION: Negative.

## 2021-11-26 ENCOUNTER — Emergency Department (HOSPITAL_COMMUNITY)
Admission: EM | Admit: 2021-11-26 | Discharge: 2021-11-26 | Disposition: A | Discharge status: Home / Self Care | Payer: Self-pay | Attending: Emergency Medicine | Admitting: Emergency Medicine

## 2021-11-26 ENCOUNTER — Other Ambulatory Visit: Payer: Self-pay

## 2021-11-26 ENCOUNTER — Encounter (HOSPITAL_COMMUNITY): Payer: Self-pay

## 2021-11-26 DIAGNOSIS — F17210 Nicotine dependence, cigarettes, uncomplicated: Secondary | ICD-10-CM | POA: Insufficient documentation

## 2021-11-26 DIAGNOSIS — L600 Ingrowing nail: Secondary | ICD-10-CM | POA: Insufficient documentation

## 2021-11-26 MED ORDER — BUPIVACAINE HCL 0.5 % IJ SOLN
50.0000 mL | Freq: Once | INTRAMUSCULAR | Status: AC
  Administered 2021-11-26: 50 mL
  Filled 2021-11-26: qty 50

## 2021-11-26 MED ORDER — HYDROCODONE-ACETAMINOPHEN 5-325 MG PO TABS: 2.0000 | ORAL_TABLET | ORAL | 0 refills | Status: AC | PRN

## 2021-11-26 NOTE — ED Provider Notes (Signed)
[facility]-EMERGENCY DEPT Provider Note: Lowella Dell, MD, FACEP  CSN: [id number] MRN: [id number] ARRIVAL: 11/26/21 at 0238 ROOM: WA24/WA24   CHIEF COMPLAINT  Toe Pain   HISTORY OF PRESENT ILLNESS  11/26/21 2:59 AM Kenneth Tanner is a 36 y.o. male with pain in his right great toe for the past 4 days.  He believes he has an ingrown toenail.  He rates the pain as a 10 out of 10, aching in nature.  It is worse with palpation or ambulation.   Past Medical History:  Diagnosis Date   Chlamydia    Shoulder injury     History reviewed. No pertinent surgical history.  Family History  Problem Relation Age of Onset   Healthy Mother    Healthy Father     Social History   Tobacco Use   Smoking status: Former    Types: Cigarettes    Quit date: 04/30/2013    Years since quitting: 8.5   Smokeless tobacco: Former  Building services engineer Use: Never used  Substance Use Topics   Alcohol use: No   Drug use: No    Prior to Admission medications   Medication Sig Start Date End Date Taking? Authorizing Provider  HYDROcodone-acetaminophen (NORCO) 5-325 MG tablet Take 2 tablets by mouth every 4 (four) hours as needed for severe pain. 11/26/21  Yes Geniene List, MD  omeprazole (PRILOSEC) 20 MG capsule Take 1 capsule (20 mg total) by mouth daily. Patient not taking: Reported on 06/09/2016 02/18/16 12/08/18  Antony Madura, PA-C    Allergies Patient has no known allergies.   REVIEW OF SYSTEMS  Negative except as noted here or in the History of Present Illness.   PHYSICAL EXAMINATION  Initial Vital Signs Blood pressure (!) 187/126, pulse 84, temperature 98.1 F (36.7 C), temperature source Oral, resp. rate 18, height 5\' 11"  (1.803 m), weight 95.3 kg, SpO2 95 %.  Examination General: Well-developed, well-nourished male in no acute distress; appearance consistent with age of record HENT: normocephalic; atraumatic Eyes: Normal appearance Neck: supple Heart: regular rate and rhythm Lungs:  clear to auscultation bilaterally Abdomen: soft; nondistended; nontender; bowel sounds present Extremities: No deformity; tenderness and pus under skin adjacent to medial aspect of right great toenail:    Neurologic: Awake, alert and oriented; motor function intact in all extremities and symmetric; no facial droop Skin: Warm and dry Psychiatric: Normal mood and affect   RESULTS  Summary of this visit's results, reviewed and interpreted by myself:   EKG Interpretation  Date/Time:    Ventricular Rate:    PR Interval:    QRS Duration:   QT Interval:    QTC Calculation:   R Axis:     Text Interpretation:         Laboratory Studies: No results found for this or any previous visit (from the past 24 hour(s)). Imaging Studies: No results found.  ED COURSE and MDM  Nursing notes, initial and subsequent vitals signs, including pulse oximetry, reviewed and interpreted by myself.  Vitals:   11/26/21 0246  BP: (!) 187/126  Pulse: 84  Resp: 18  Temp: 98.1 F (36.7 C)  TempSrc: Oral  SpO2: 95%  Weight: 95.3 kg  Height: 5\' 11"  (1.803 m)   Medications  bupivacaine (MARCAINE) 0.5 % (with pres) injection 50 mL (50 mLs Infiltration Given 11/26/21 0321)      PROCEDURES  .Nail Removal  Date/Time: 11/26/2021 3:26 AM  Performed by: Estuardo Frisbee, MD Authorized by: Nakira Litzau, 11/28/21, MD  Consent:    Consent obtained:  Verbal   Consent given by:  Patient   Risks, benefits, and alternatives were discussed: yes     Risks discussed:  Pain and permanent nail deformity Universal protocol:    Procedure explained and questions answered to patient or proxy's satisfaction: yes     Required blood products, implants, devices, and special equipment available: yes     Immediately prior to procedure, a time out was called: yes     Patient identity confirmed:  Verbally with patient and arm band Location:    Foot:  R big toe Pre-procedure details:    Skin preparation:  Povidone-iodine    Preparation: Patient was prepped and draped in the usual sterile fashion   Anesthesia:    Anesthesia method: Hemi digital block. Nails trimmed:    Number of nails trimmed: Wedge excision of ingrown medial aspect of right great toenail. Post-procedure details:    Dressing:  Xeroform gauze   Procedure completion:  Tolerated well, no immediate complications    ED DIAGNOSES     ICD-10-CM   1. Ingrown right big toenail  L60.0          Emersynn Deatley, Jonny Ruiz, MD 11/26/21 [phone]

## 2021-11-26 NOTE — ED Triage Notes (Signed)
Pt reports having an ingrown toe nail to his right big toe since this past weekend.

## 2022-02-02 IMAGING — CR DG SHOULDER 2+V*R*
3 series · 3 of 3 positions shown · non-contrast
Comparison: 08/04/2013

CLINICAL DATA: Injury

EXAM:
RIGHT SHOULDER - 2+ VIEW

[w shoulder external right]
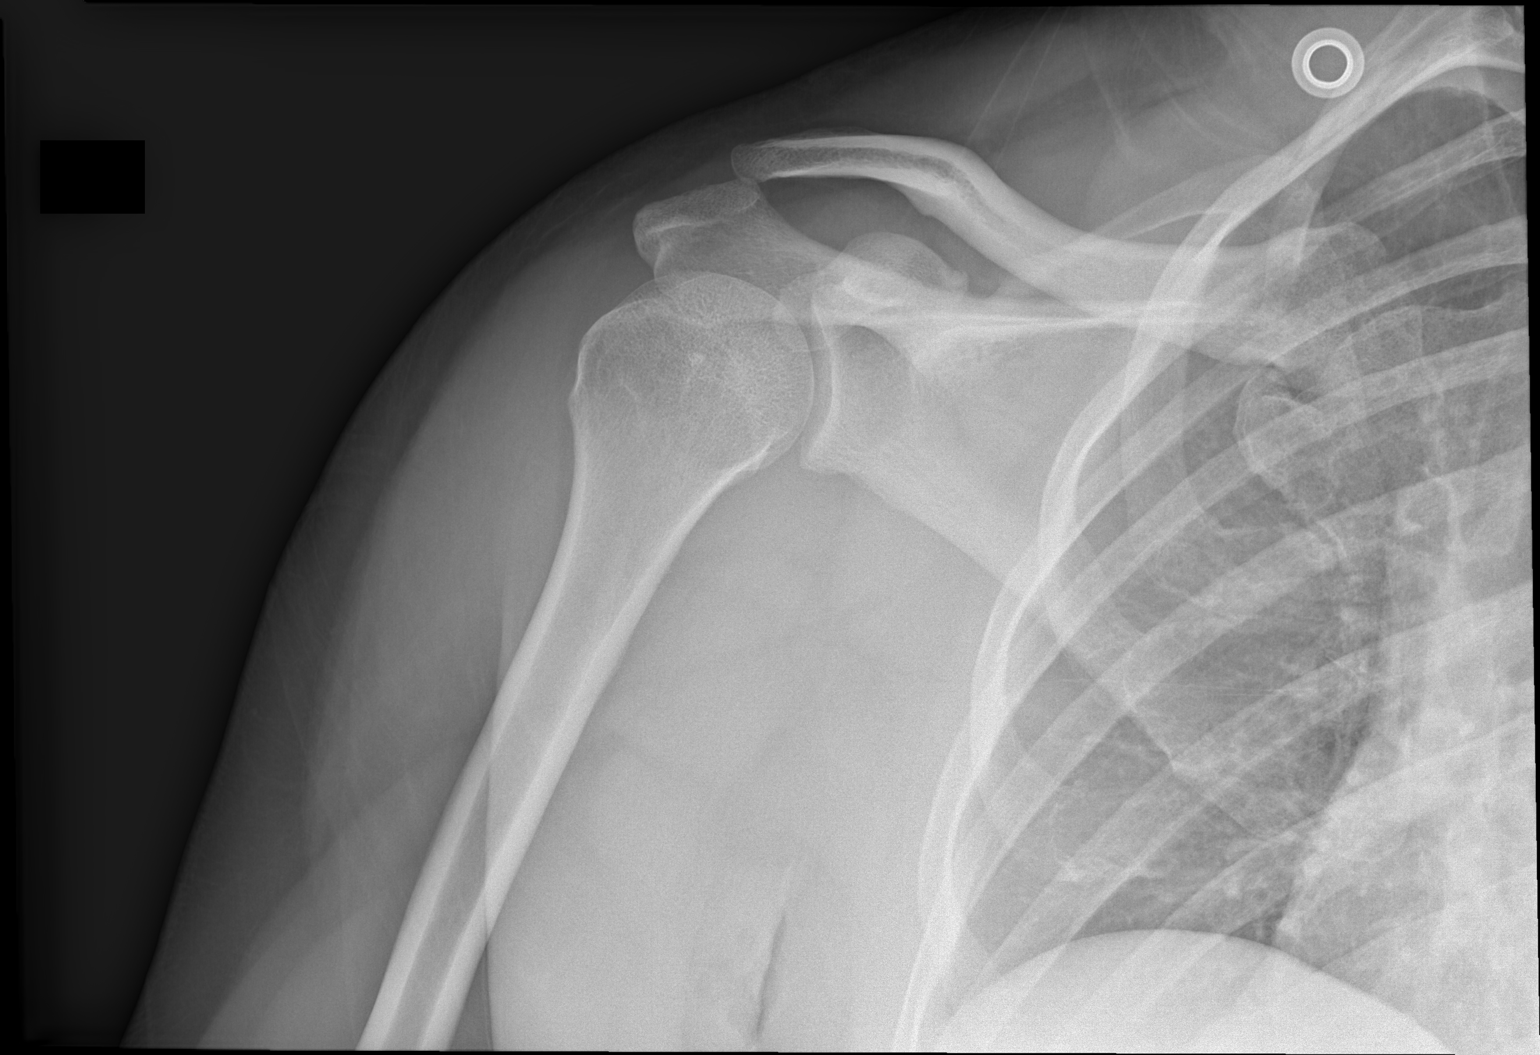

[w shoulder y-view right]
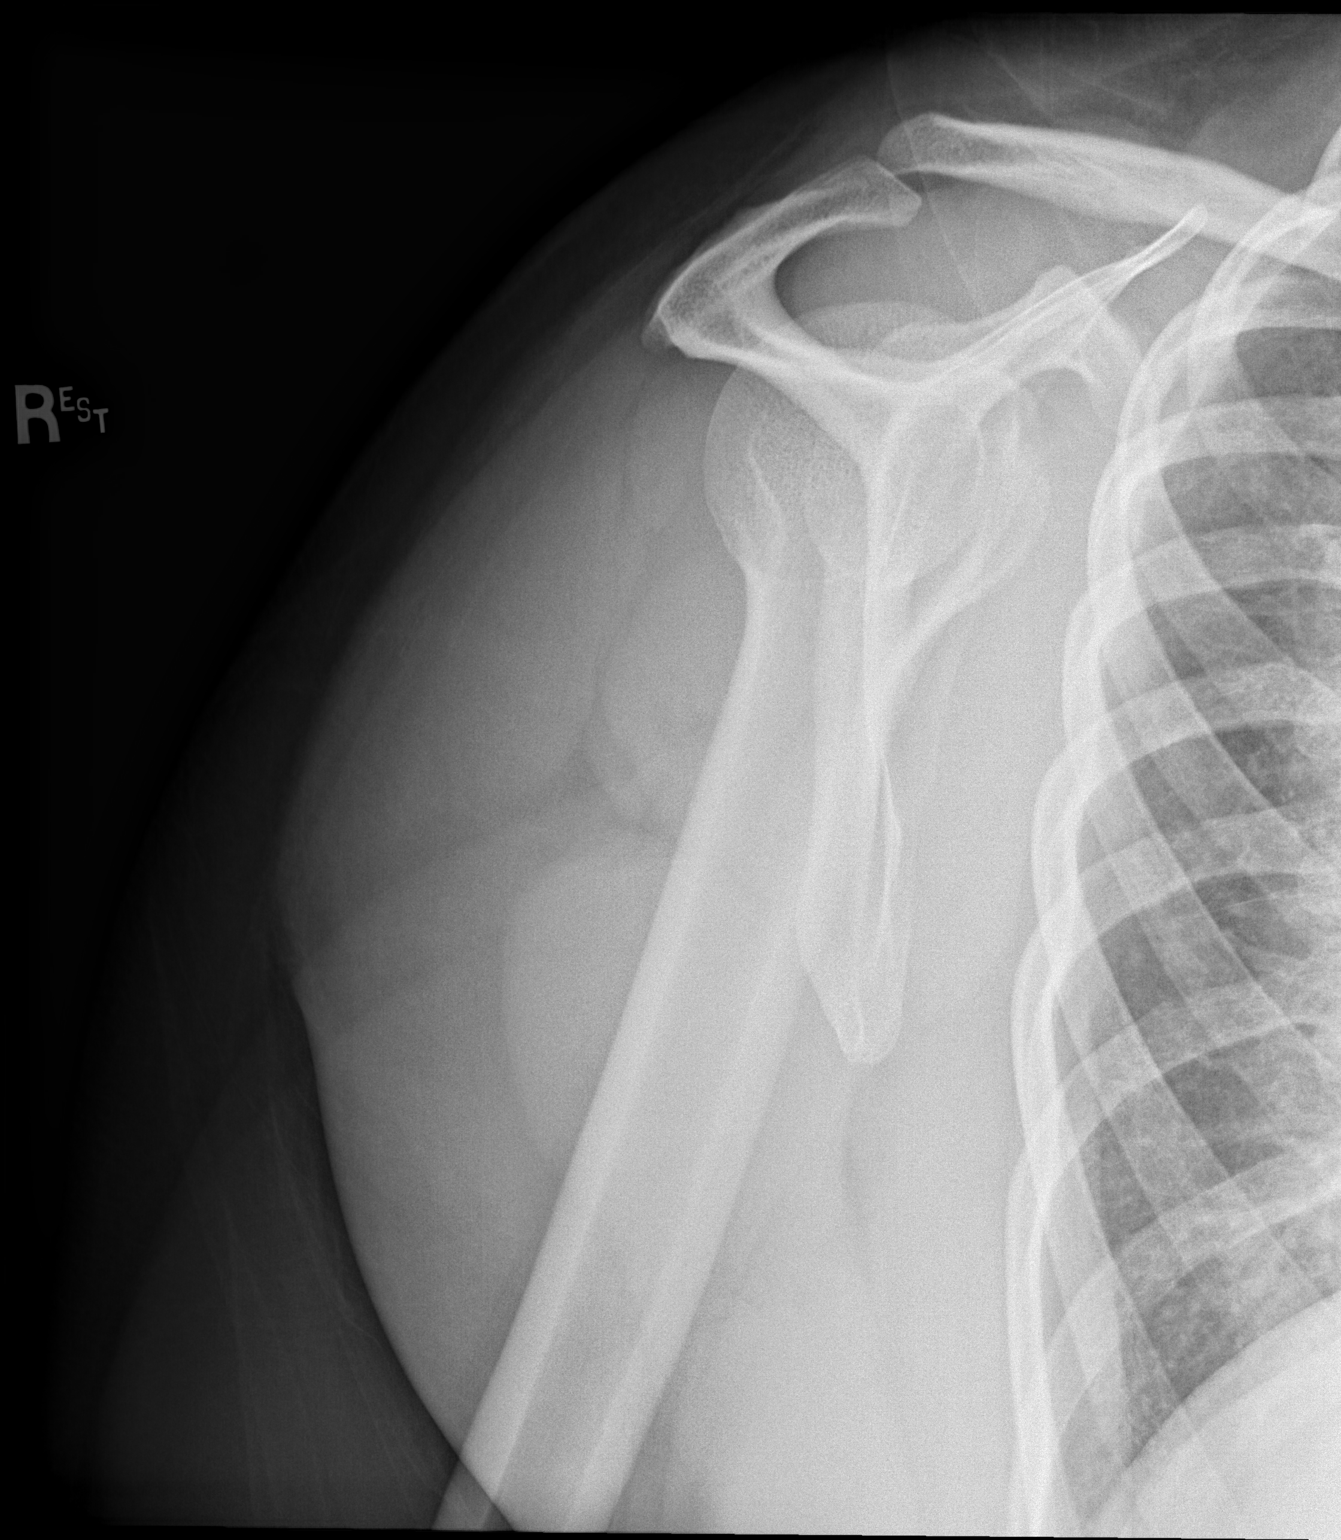

[x shoulder axillary right]
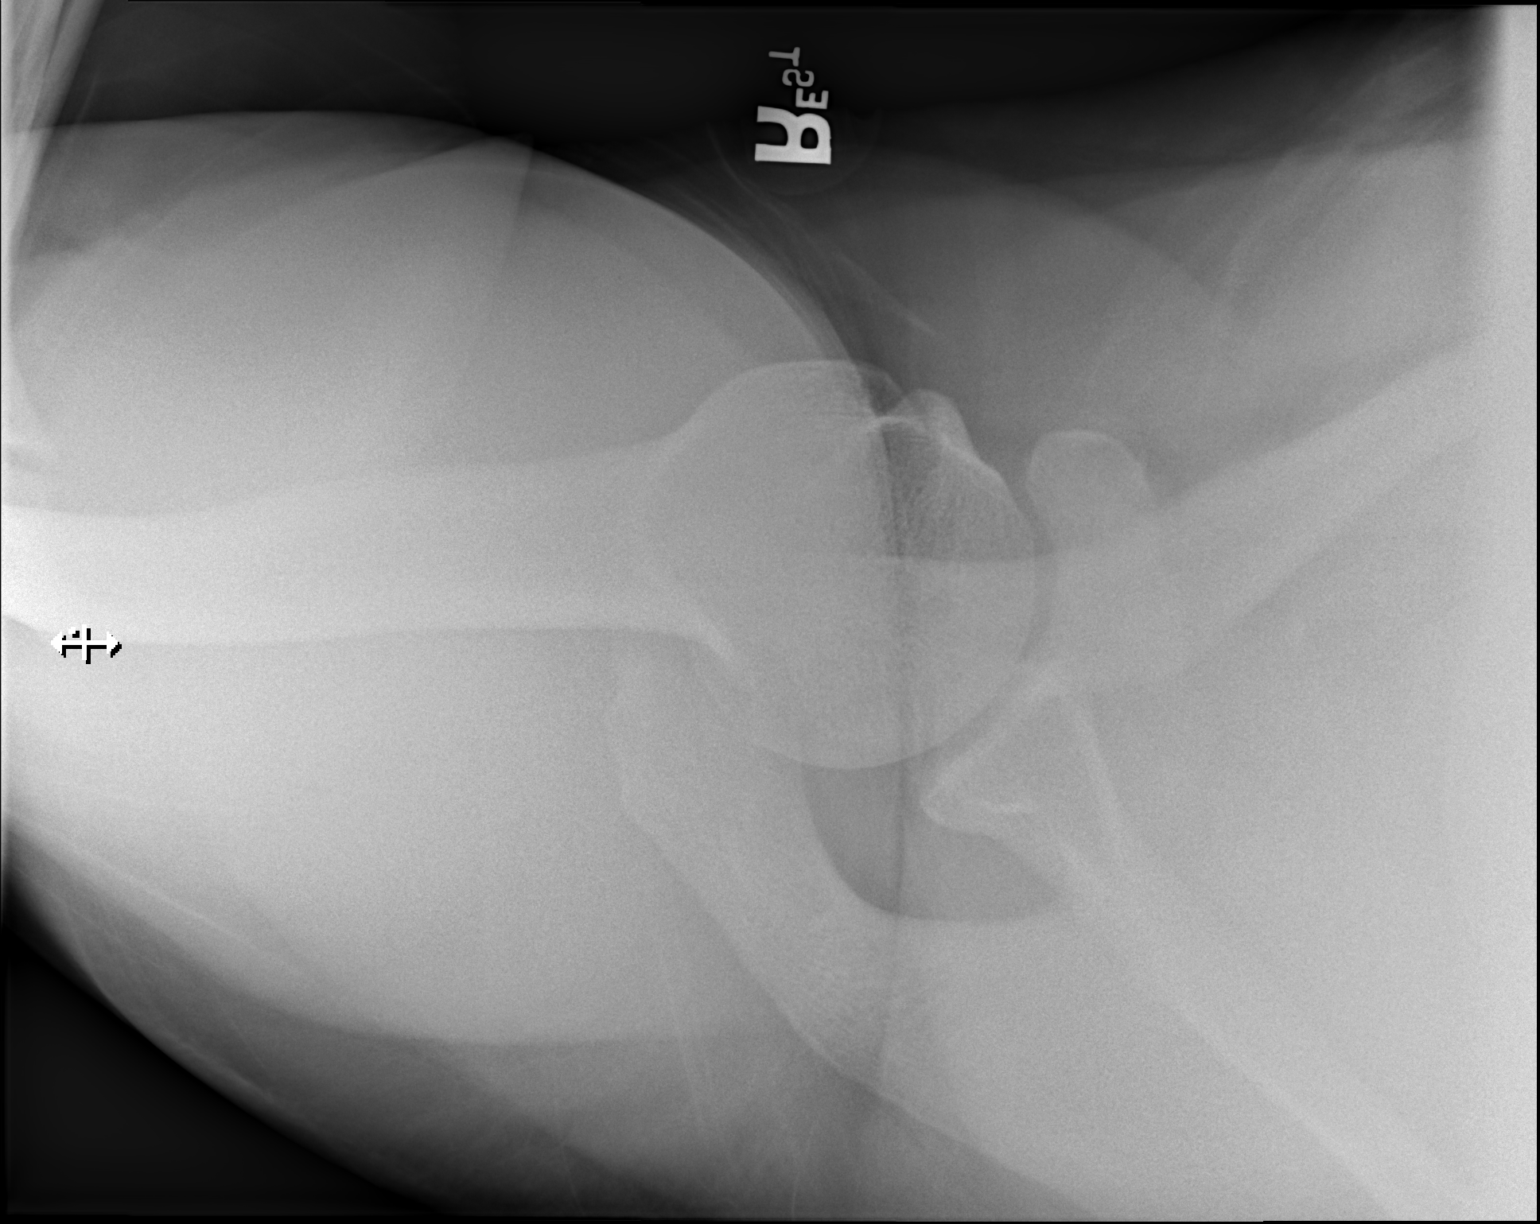

[3 of 3 positions shown; findings below may reference images not displayed]

FINDINGS: Chronic right AC separation is similar to the prior study. No
fracture. Shoulder joint normal.
IMPRESSION: Chronic right AC separation.  No acute abnormality.

## 2022-04-02 ENCOUNTER — Encounter (HOSPITAL_COMMUNITY): Payer: Self-pay | Admitting: Emergency Medicine

## 2022-04-02 ENCOUNTER — Emergency Department (HOSPITAL_COMMUNITY)
Admission: EM | Admit: 2022-04-02 | Discharge: 2022-04-03 | Disposition: A | Discharge status: Home / Self Care | Payer: Self-pay | Attending: Emergency Medicine | Admitting: Emergency Medicine

## 2022-04-02 ENCOUNTER — Emergency Department (HOSPITAL_COMMUNITY): Payer: Self-pay

## 2022-04-02 ENCOUNTER — Other Ambulatory Visit: Payer: Self-pay

## 2022-04-02 DIAGNOSIS — M25561 Pain in right knee: Secondary | ICD-10-CM | POA: Insufficient documentation

## 2022-04-02 NOTE — ED Triage Notes (Signed)
Pt c/o right knee pain x 2 weeks. Denies injury

## 2022-04-03 MED ORDER — NAPROXEN 500 MG PO TABS: 500.0000 mg | ORAL_TABLET | Freq: Two times a day (BID) | ORAL | 0 refills | Status: AC

## 2022-04-03 NOTE — Discharge Instructions (Signed)
You have been seen here for knee pain. I recommend taking over-the-counter pain medications like  Tylenol every 6 as needed.  Please follow dosage and on the back of bottle.  I also recommend applying  ice to he area  this will help decrease stiffness and pain.    If Symptoms not improved after a week's time follow-up with Ortho as needed  Come back to the emergency department if you develop chest pain, shortness of breath, severe abdominal pain, uncontrolled nausea, vomiting, diarrhea.

## 2022-04-03 NOTE — ED Provider Notes (Signed)
[facility] DEPT Provider Note   CSN: [id number] Arrival date & time: 04/02/22  2237     History  Chief Complaint  Patient presents with   Knee Pain    Kenneth Tanner is a 36 y.o. male.  HPI   Patient not significant medical history presents with complaints of right-sided knee pain been going on for last 2 weeks, states that he believes he aggravated getting in and out of his truck.  He states that he works for a [employer] and states that he feels constant pain in his knee mainly on the medial aspect, no paresthesia or weakness moving down his leg denies any calf tenderness or leg swelling no shortness of breath or chest pain, no history PEs or DVTs currently not on hormone therapy.  No history of IV drug use, has not had anything for pain.  Home Medications Prior to Admission medications   Medication Sig Start Date End Date Taking? Authorizing Provider  naproxen (NAPROSYN) 500 MG tablet Take 1 tablet (500 mg total) by mouth 2 (two) times daily for 10 days. 04/03/22 04/13/22 Yes Marcello Fennel, PA-C  HYDROcodone-acetaminophen (NORCO) 5-325 MG tablet Take 2 tablets by mouth every 4 (four) hours as needed for severe pain. 11/26/21   Molpus, John, MD  omeprazole (PRILOSEC) 20 MG capsule Take 1 capsule (20 mg total) by mouth daily. Patient not taking: Reported on 06/09/2016 02/18/16 12/08/18  Antonietta Breach, PA-C      Allergies    Patient has no known allergies.    Review of Systems   Review of Systems  Constitutional:  Negative for chills and fever.  Respiratory:  Negative for shortness of breath.   Cardiovascular:  Negative for chest pain.  Gastrointestinal:  Negative for abdominal pain.  Musculoskeletal:        Knee pain  Neurological:  Negative for headaches.    Physical Exam Updated Vital Signs BP (!) 138/98   Pulse (!) 101   Temp 98.4 F (36.9 C) (Oral)   Resp 18   Ht 5\' 11"  (1.803 m)   Wt 96 kg   SpO2 98%   BMI 29.52 kg/m   Physical Exam Vitals and nursing note reviewed.  Constitutional:      General: He is not in acute distress.    Appearance: He is not ill-appearing.  HENT:     Head: Normocephalic and atraumatic.     Nose: No congestion.  Eyes:     Conjunctiva/sclera: Conjunctivae normal.  Cardiovascular:     Rate and Rhythm: Normal rate and regular rhythm.     Pulses: Normal pulses.  Pulmonary:     Effort: Pulmonary effort is normal.  Musculoskeletal:     Comments: Focused exam of the right knee was unremarkable no erythema, no edema, he has full range of motion in his knee, there is no joint laxity, no palpable cords, no unilateral leg swelling, he has 2+ dorsal pedal pulses.  Skin:    General: Skin is warm and dry.  Neurological:     Mental Status: He is alert.  Psychiatric:        Mood and Affect: Mood normal.     ED Results / Procedures / Treatments   Labs (all labs ordered are listed, but only abnormal results are displayed) Labs Reviewed - No data to display  EKG None  Radiology DG Knee Complete 4 Views Right  Result Date: 04/02/2022 CLINICAL DATA:  Knee pain. EXAM: RIGHT KNEE - COMPLETE 4+ VIEW  COMPARISON:  None Available. FINDINGS: No evidence of fracture, dislocation, or joint effusion. No evidence of arthropathy or other focal bone abnormality. Soft tissues are unremarkable. IMPRESSION: Negative. Electronically Signed   By: Larose Hires D.O.   On: 04/02/2022 23:04    Procedures Procedures    Medications Ordered in ED Medications - No data to display  ED Course/ Medical Decision Making/ A&P                           Medical Decision Making Amount and/or Complexity of Data Reviewed Radiology: ordered.   This patient presents to the ED for concern of right knee pain, this involves an extensive number of treatment options, and is a complaint that carries with it a high risk of complications and morbidity.  The differential diagnosis includes fracture, dislocation,  compartment syndrome.    Additional history obtained:  Additional history obtained from N/A External records from outside source obtained and reviewed including N/A   Co morbidities that complicate the patient evaluation  N/A  Social Determinants of Health:  N/A    Lab Tests:  I Ordered, and personally interpreted labs.  The pertinent results include: N/A   Imaging Studies ordered:  I ordered imaging studies including n/a I independently visualized and interpreted imaging which showed n/a I agree with the radiologist interpretation   Cardiac Monitoring:  The patient was maintained on a cardiac monitor.  I personally viewed and interpreted the cardiac monitored which showed an underlying rhythm of: n/a   Medicines ordered and prescription drug management:  I ordered medication including n/a I have reviewed the patients home medicines and have made adjustments as needed  Critical Interventions:  N/a   Reevaluation: Patient has a benign physical exam, imaging is unremarkable he is agreement with discharge at this time. Consultations Obtained:  N/a    Test Considered:  N/a    Rule out I have low suspicion for septic arthritis as patient denies IV drug use, skin exam was performed no erythematous, edematous, warm joints noted on exam, no new heart murmur heard on exam.  Low suspicion for fracture or dislocation as x-ray does not feel any significant findings. low suspicion for ligament or tendon damage as area was palpated no gross defects noted, they had full range of motion as well as 5/5 strength.  Low suspicion for compartment syndrome as area was palpated it was soft to the touch, neurovascular fully intact.  Low suspicion for DVT no urinary leg swelling no calf tenderness presentation atypical of etiology.  I doubt limb ischemia no skin changes, he has 2+ dorsal pedal pulses sensation fully intact.     Dispostion and problem list  After  consideration of the diagnostic results and the patients response to treatment, I feel that the patent would benefit from DC   Knee pain-.  Likely a strain, will provide with NSAIDs follow-up with orthopedic as needed strict return precautions.            Final Clinical Impression(s) / ED Diagnoses Final diagnoses:  Acute pain of right knee    Rx / DC Orders ED Discharge Orders          Ordered    naproxen (NAPROSYN) 500 MG tablet  2 times daily        04/03/22 0311              Carroll Sage, PA-C 04/03/22 1941    Zadie Rhine, MD  04/03/22 0531  

## 2022-04-22 ENCOUNTER — Encounter (HOSPITAL_BASED_OUTPATIENT_CLINIC_OR_DEPARTMENT_OTHER): Payer: Self-pay | Admitting: Emergency Medicine

## 2022-04-22 ENCOUNTER — Other Ambulatory Visit: Payer: Self-pay

## 2022-04-22 ENCOUNTER — Emergency Department (HOSPITAL_BASED_OUTPATIENT_CLINIC_OR_DEPARTMENT_OTHER)
Admission: EM | Admit: 2022-04-22 | Discharge: 2022-04-22 | Disposition: A | Discharge status: Home / Self Care | Payer: Self-pay | Attending: Emergency Medicine | Admitting: Emergency Medicine

## 2022-04-22 DIAGNOSIS — H6123 Impacted cerumen, bilateral: Secondary | ICD-10-CM | POA: Insufficient documentation

## 2022-04-22 MED ORDER — ACETAMINOPHEN 500 MG PO TABS
1000.0000 mg | ORAL_TABLET | Freq: Once | ORAL | Status: AC
  Administered 2022-04-22: 1000 mg via ORAL
  Filled 2022-04-22: qty 2

## 2022-04-22 NOTE — ED Provider Notes (Signed)
[facility] EMERGENCY DEPARTMENT Provider Note   CSN: [id number] Arrival date & time: 04/22/22  [id number]     History  Chief Complaint  Patient presents with   Otalgia    Kenneth Tanner is a 36 y.o. male.  The history is provided by the patient.  Otalgia Location:  Right Behind ear:  No abnormality Severity:  Moderate Onset quality:  Gradual Duration:  1 day Timing:  Constant Progression:  Unchanged Chronicity:  New Context: not recent URI   Context comment:  Patient feels like something is moving in the ear and there is an echo in the ear Relieved by:  Nothing Worsened by:  Nothing Ineffective treatments:  None tried Associated symptoms: no fever        Home Medications Prior to Admission medications   Medication Sig Start Date End Date Taking? Authorizing Provider  HYDROcodone-acetaminophen (NORCO) 5-325 MG tablet Take 2 tablets by mouth every 4 (four) hours as needed for severe pain. 11/26/21   Molpus, John, MD  omeprazole (PRILOSEC) 20 MG capsule Take 1 capsule (20 mg total) by mouth daily. Patient not taking: Reported on 06/09/2016 02/18/16 12/08/18  Antonietta Breach, PA-C      Allergies    Patient has no known allergies.    Review of Systems   Review of Systems  Constitutional:  Negative for fever.  HENT:  Positive for ear pain.   Eyes:  Negative for redness.  Respiratory:  Negative for wheezing and stridor.   All other systems reviewed and are negative.   Physical Exam Updated Vital Signs BP 139/81 (BP Location: Left Arm)   Pulse 95   Temp 98.5 F (36.9 C) (Oral)   Resp 16   Ht 6\' 1"  (1.854 m)   Wt 127 kg   SpO2 97%   BMI 36.94 kg/m  Physical Exam Vitals and nursing note reviewed.  Constitutional:      General: He is not in acute distress.    Appearance: He is well-developed. He is not diaphoretic.  HENT:     Head: Normocephalic and atraumatic.     Ears:     Comments: B cerumen impaction     Nose: Nose normal.  Eyes:      Conjunctiva/sclera: Conjunctivae normal.     Pupils: Pupils are equal, round, and reactive to light.  Cardiovascular:     Rate and Rhythm: Normal rate and regular rhythm.     Pulses: Normal pulses.     Heart sounds: Normal heart sounds.  Pulmonary:     Effort: Pulmonary effort is normal.     Breath sounds: Normal breath sounds. No wheezing or rales.  Abdominal:     General: Bowel sounds are normal.     Palpations: Abdomen is soft.     Tenderness: There is no abdominal tenderness. There is no guarding or rebound.  Musculoskeletal:        General: Normal range of motion.     Cervical back: Normal range of motion and neck supple.  Skin:    General: Skin is warm and dry.     Capillary Refill: Capillary refill takes less than 2 seconds.  Neurological:     General: No focal deficit present.     Mental Status: He is alert and oriented to person, place, and time.  Psychiatric:        Mood and Affect: Mood normal.     ED Results / Procedures / Treatments   Labs (all labs ordered are listed, but only  abnormal results are displayed) Labs Reviewed - No data to display  EKG None  Radiology No results found.  Procedures Procedures    Medications Ordered in ED Medications  acetaminophen (TYLENOL) tablet 1,000 mg (1,000 mg Oral Given 04/22/22 0154)    ED Course/ Medical Decision Making/ A&P                           Medical Decision Making Patient with a sensation of something moving in the right ear and echo   Amount and/or Complexity of Data Reviewed External Data Reviewed: notes.    Details: Previous notes reviewed   Risk OTC drugs. Risk Details: B ears irrigated in the ED.  All wax removed and B canals and TMs are normal post irrigation.  Patient reports marked improvement in symptoms. Do not use Qtips in the ears.  Stable for discharge.      Final Clinical Impression(s) / ED Diagnoses Final diagnoses:  Bilateral impacted cerumen   Return for intractable cough,  coughing up blood, fevers > 100.4 unrelieved by medication, shortness of breath, intractable vomiting, chest pain, shortness of breath, weakness, numbness, changes in speech, facial asymmetry, abdominal pain, passing out, Inability to tolerate liquids or food, cough, altered mental status or any concerns. No signs of systemic illness or infection. The patient is nontoxic-appearing on exam and vital signs are within normal limits.  I have reviewed the triage vital signs and the nursing notes. Pertinent labs & imaging results that were available during my care of the patient were reviewed by me and considered in my medical decision making (see chart for details). After history, exam, and medical workup I feel the patient has been appropriately medically screened and is safe for discharge home. Pertinent diagnoses were discussed with the patient. Patient was given return precautions.    Rx / DC Orders ED Discharge Orders     None         Tacie Mccuistion, MD 04/22/22 9417

## 2022-04-22 NOTE — ED Notes (Signed)
Bilateral ears irrigated and large amount of wax was removed.

## 2022-04-22 NOTE — ED Triage Notes (Signed)
Right side ear pain "feels like something crawling" has echo. X 1 day.

## 2022-11-16 ENCOUNTER — Ambulatory Visit (HOSPITAL_BASED_OUTPATIENT_CLINIC_OR_DEPARTMENT_OTHER): Payer: Self-pay | Admitting: Student

## 2023-02-18 ENCOUNTER — Emergency Department (HOSPITAL_COMMUNITY)
Admission: EM | Admit: 2023-02-18 | Discharge: 2023-02-18 | Disposition: A | Discharge status: Home / Self Care | Payer: Self-pay | Attending: Emergency Medicine | Admitting: Emergency Medicine

## 2023-02-18 ENCOUNTER — Other Ambulatory Visit: Payer: Self-pay

## 2023-02-18 ENCOUNTER — Encounter (HOSPITAL_COMMUNITY): Payer: Self-pay | Admitting: Emergency Medicine

## 2023-02-18 DIAGNOSIS — H1031 Unspecified acute conjunctivitis, right eye: Secondary | ICD-10-CM | POA: Insufficient documentation

## 2023-02-18 MED ORDER — ERYTHROMYCIN 5 MG/GM OP OINT: TOPICAL_OINTMENT | OPHTHALMIC | 0 refills | Status: AC

## 2023-02-18 NOTE — ED Triage Notes (Signed)
Pt c/o right eye pain and yellow drainage since this morning. Sts concern for possible bug bite. Denies vision changes. No trauma to eye. No chemical exposure.

## 2023-02-18 NOTE — ED Provider Notes (Signed)
[place] EMERGENCY DEPARTMENT AT [facility] Provider Note   CSN: [id number] Arrival date & time: 02/18/23  2102     History Chief Complaint  Patient presents with   Eye Pain    R    HPI Kenneth Tanner is a 37 y.o. male presenting for chief complaint of eye pain in her right eye.  Denies fevers chills nausea vomiting shortness of breath otherwise ambulatory tolerating p.o. intake..   Patient's recorded medical, surgical, social, medication list and allergies were reviewed in the Snapshot window as part of the initial history.   Review of Systems   Review of Systems  Constitutional:  Negative for chills and fever.  HENT:  Negative for ear pain and sore throat.   Eyes:  Positive for pain and discharge. Negative for visual disturbance.  Respiratory:  Negative for cough and shortness of breath.   Cardiovascular:  Negative for chest pain and palpitations.  Gastrointestinal:  Negative for abdominal pain and vomiting.  Genitourinary:  Negative for dysuria and hematuria.  Musculoskeletal:  Negative for arthralgias and back pain.  Skin:  Negative for color change and rash.  Neurological:  Negative for seizures and syncope.  All other systems reviewed and are negative.   Physical Exam Updated Vital Signs BP (!) 137/93 (BP Location: Left Arm)   Pulse 84   Temp 98.1 F (36.7 C) (Oral)   Resp 19   Ht 6\' 1"  (1.854 m)   Wt 127 kg   SpO2 97%   BMI 36.94 kg/m  Physical Exam Vitals and nursing note reviewed.  Constitutional:      General: He is not in acute distress.    Appearance: He is well-developed.  HENT:     Head: Normocephalic and atraumatic.  Eyes:     Conjunctiva/sclera: Conjunctivae normal.  Cardiovascular:     Rate and Rhythm: Normal rate and regular rhythm.     Heart sounds: No murmur heard. Pulmonary:     Effort: Pulmonary effort is normal. No respiratory distress.     Breath sounds: Normal breath sounds.  Abdominal:     Palpations: Abdomen is  soft.     Tenderness: There is no abdominal tenderness.  Musculoskeletal:        General: No swelling.     Cervical back: Neck supple.  Skin:    General: Skin is warm and dry.     Capillary Refill: Capillary refill takes less than 2 seconds.  Neurological:     Mental Status: He is alert.  Psychiatric:        Mood and Affect: Mood normal.      ED Course/ Medical Decision Making/ A&P    Procedures Procedures   Medications Ordered in ED Medications - No data to display Medical Decision Making:    Kenneth Tanner  is a 37 y.o.  who presented to the ED today with visual disturbance detailed above.    This is most consistent with an acute potentially threatening illness complicated by underlying chronic conditions.  Complete initial physical exam performed, notably the patient  was hemodynamically stable in no acute distress.    Notably, patient's eye exam is as above   Visual Acuity   L: 20:20  R:20:20   External Exam  R:NAA   Patient placed onto telemetry monitor while in ED which was reviewed.   Reviewed and confirmed nursing documentation for past medical history, family history, social history.    Assessment:   Patient's history of present illness and physical  exam findings findings do not reveal any acute eye pathology.  Considered orbital cellulitis however patient has no pain with extraocular motions or gross orbital swelling, acute glaucoma however patient's eyes are with normal pressures bilaterally, HSV orbital infection however patient's eyes are without reticular staining pattern, corneal abrasion/ulcer however patient's eyes are without fluorescein uptake or visual corneal abnormality on slit-lamp exam, open globe injury however patient's history of present illness and physical exam findings are grossly inconsistent   Plan:  Ultimately as patient's diagnosis is most consistent with no acute pathology.  Likely developing bacterial conjunctivitis given mucopurulent  discharge this morning.  Will treat with erythromycin ointment to twice daily follow-up with PCP in 72 hours for reassessment.  Disposition:  I have considered need for hospitalization, however, considering all of the above, I believe this patient is stable for discharge at this time.  Patient/family educated about specific return precautions for given chief complaint and symptoms.  Patient/family educated about follow-up with PCP.     Patient/family expressed understanding of return precautions and need for follow-up. Patient spoken to regarding all imaging and laboratory results and appropriate follow up for these results. All education provided in verbal form with additional information in written form. Time was allowed for answering of patient questions. Patient discharged.    Emergency Department Medication Summary:   Medications - No data to display      Clinical Impression:  1. Acute bacterial conjunctivitis of right eye      Discharge   Final Clinical Impression(s) / ED Diagnoses Final diagnoses:  Acute bacterial conjunctivitis of right eye    Rx / DC Orders ED Discharge Orders          Ordered    erythromycin ophthalmic ointment        02/18/23 2150              Glyn Ade, MD 02/18/23 2151

## 2023-05-27 ENCOUNTER — Emergency Department (HOSPITAL_COMMUNITY): Payer: Self-pay

## 2023-05-27 ENCOUNTER — Emergency Department (HOSPITAL_COMMUNITY)
Admission: EM | Admit: 2023-05-27 | Discharge: 2023-05-27 | Disposition: A | Discharge status: Home / Self Care | Payer: Self-pay

## 2023-05-27 ENCOUNTER — Other Ambulatory Visit: Payer: Self-pay

## 2023-05-27 ENCOUNTER — Encounter (HOSPITAL_COMMUNITY): Payer: Self-pay

## 2023-05-27 DIAGNOSIS — N50812 Left testicular pain: Secondary | ICD-10-CM | POA: Insufficient documentation

## 2023-05-27 LAB — URINALYSIS, ROUTINE W REFLEX MICROSCOPIC
Bilirubin Urine: NEGATIVE
Glucose, UA: NEGATIVE mg/dL
Hgb urine dipstick: NEGATIVE
Ketones, ur: NEGATIVE mg/dL
Leukocytes,Ua: NEGATIVE
Nitrite: NEGATIVE
Protein, ur: NEGATIVE mg/dL
Specific Gravity, Urine: 1.02 (ref 1.005–1.030)
pH: 6 (ref 5.0–8.0)

## 2023-05-27 LAB — CBC
HCT: 45.6 % (ref 39.0–52.0)
Hemoglobin: 15 g/dL (ref 13.0–17.0)
MCH: 28.5 pg (ref 26.0–34.0)
MCHC: 32.9 g/dL (ref 30.0–36.0)
MCV: 86.5 fL (ref 80.0–100.0)
Platelets: 325 10*3/uL (ref 150–400)
RBC: 5.27 MIL/uL (ref 4.22–5.81)
RDW: 13.3 % (ref 11.5–15.5)
WBC: 5.7 10*3/uL (ref 4.0–10.5)
nRBC: 0 % (ref 0.0–0.2)

## 2023-05-27 LAB — BASIC METABOLIC PANEL
Anion gap: 7 (ref 5–15)
BUN: 8 mg/dL (ref 6–20)
CO2: 24 mmol/L (ref 22–32)
Calcium: 9 mg/dL (ref 8.9–10.3)
Chloride: 104 mmol/L (ref 98–111)
Creatinine, Ser: 0.92 mg/dL (ref 0.61–1.24)
GFR, Estimated: >60 mL/min (ref >60)
Glucose, Bld: 102 mg/dL — ABNORMAL HIGH (ref 70–99)
Potassium: 4 mmol/L (ref 3.5–5.1)
Sodium: 135 mmol/L (ref 135–145)

## 2023-05-27 LAB — HIV ANTIBODY (ROUTINE TESTING W REFLEX): HIV Screen 4th Generation wRfx: NONREACTIVE

## 2023-05-27 MED ORDER — CEFTRIAXONE SODIUM 1 G IJ SOLR
500.0000 mg | Freq: Once | INTRAMUSCULAR | Status: AC
  Administered 2023-05-27: 500 mg via INTRAMUSCULAR
  Filled 2023-05-27: qty 10

## 2023-05-27 MED ORDER — STERILE WATER FOR INJECTION IJ SOLN
INTRAMUSCULAR | Status: AC
  Filled 2023-05-27: qty 10

## 2023-05-27 MED ORDER — IBUPROFEN 600 MG PO TABS: 600.0000 mg | ORAL_TABLET | Freq: Four times a day (QID) | ORAL | 0 refills | Status: AC | PRN

## 2023-05-27 MED ORDER — DOXYCYCLINE HYCLATE 100 MG PO CAPS: 100.0000 mg | ORAL_CAPSULE | Freq: Two times a day (BID) | ORAL | 0 refills | Status: AC

## 2023-05-27 NOTE — ED Provider Notes (Signed)
[place] EMERGENCY DEPARTMENT AT [facility] Provider Note   CSN: [id number] Arrival date & time: 05/27/23  1100     History  Chief Complaint  Patient presents with   Testicle Pain    Kenneth Tanner is a 37 y.o. male.  The history is provided by the patient and medical records. No language interpreter was used.  Testicle Pain     37 year old male with prior history of STI presenting with complaints of testicle pain.  Patient report for the past 2 days he has noticed pain primarily to his left testicle.  Pain is more noticeable when he sits sounds and when she walks around.  Pain is nonradiating and there are no associated fever or chills no abdominal pain no back pain no hematuria dysuria penile discharge or having a rash.  He does endorse some unprotected sex recently.    Home Medications Prior to Admission medications   Medication Sig Start Date End Date Taking? Authorizing Provider  erythromycin ophthalmic ointment Place a 1/2 inch ribbon of ointment into the lower eyelid. 02/18/23   Glyn Ade, MD  HYDROcodone-acetaminophen (NORCO) 5-325 MG tablet Take 2 tablets by mouth every 4 (four) hours as needed for severe pain. 11/26/21   Molpus, John, MD  omeprazole (PRILOSEC) 20 MG capsule Take 1 capsule (20 mg total) by mouth daily. Patient not taking: Reported on 06/09/2016 02/18/16 12/08/18  Antony Madura, PA-C      Allergies    Patient has no known allergies.    Review of Systems   Review of Systems  Genitourinary:  Positive for testicular pain.  All other systems reviewed and are negative.   Physical Exam Updated Vital Signs BP (!) 148/107   Pulse 82   Temp 97.8 F (36.6 C) (Oral)   Resp 19   Ht 6\' 1"  (1.854 m)   Wt 127 kg   SpO2 98%   BMI 36.94 kg/m  Physical Exam Vitals and nursing note reviewed.  Constitutional:      General: He is not in acute distress.    Appearance: He is well-developed.  HENT:     Head: Atraumatic.  Eyes:      Conjunctiva/sclera: Conjunctivae normal.  Abdominal:     Palpations: Abdomen is soft.     Tenderness: There is no abdominal tenderness. There is no right CVA tenderness or left CVA tenderness.  Genitourinary:    Comments: Chaperone present during exam.  No inguinal lymphadenopathy or inguinal hernia noted.  Normal circumcised penis free of lesion or rash.  Tenderness to left testicle on palpation without any significant scrotal swelling or any obvious edema noted.  Testicles with normal lie. Musculoskeletal:     Cervical back: Neck supple.  Skin:    Findings: No rash.  Neurological:     Mental Status: He is alert.     ED Results / Procedures / Treatments   Labs (all labs ordered are listed, but only abnormal results are displayed) Labs Reviewed  BASIC METABOLIC PANEL - Abnormal; Notable for the following components:      Result Value   Glucose, Bld 102 (*)    All other components within normal limits  URINALYSIS, ROUTINE W REFLEX MICROSCOPIC  HIV ANTIBODY (ROUTINE TESTING W REFLEX)  CBC  RPR  GC/CHLAMYDIA PROBE AMP ([place]) NOT AT [facility]    EKG None  Radiology US SCROTUM W/DOPPLER Result Date: 05/27/2023 CLINICAL DATA:  Left testicular pain EXAM: SCROTAL ULTRASOUND DOPPLER ULTRASOUND OF THE TESTICLES TECHNIQUE: Complete ultrasound examination  of the testicles, epididymis, and other scrotal structures was performed. Color and spectral Doppler ultrasound were also utilized to evaluate blood flow to the testicles. COMPARISON:  CT abdomen pelvis with contrast June 09, 2016 FINDINGS: Right testicle Measurements: 4.9 x 3.1 x 3.2 cm. No mass or microlithiasis visualized. Left testicle Measurements: 4.7 x 2.6 x 3.0 cm. No mass or microlithiasis visualized. Right epididymis:  Normal in size and appearance. Left epididymis:  Normal in size and appearance. Hydrocele:  None visualized. Varicocele:  None visualized. Pulsed Doppler interrogation of both testes demonstrates normal low  resistance arterial and venous waveforms bilaterally. IMPRESSION: Normal scrotal ultrasound. No etiology for left testicular pain is identified. Electronically Signed   By: Jacob Moores M.D.   On: 05/27/2023 14:31    Procedures Procedures    Medications Ordered in ED Medications  cefTRIAXone (ROCEPHIN) injection 500 mg (has no administration in time range)    ED Course/ Medical Decision Making/ A&P                                 Medical Decision Making  BP (!) 148/107   Pulse 82   Temp 97.8 F (36.6 C) (Oral)   Resp 19   Ht 6\' 1"  (1.854 m)   Wt 127 kg   SpO2 98%   BMI 36.94 kg/m   41:65 PM 36 year old male with prior history of STI presenting with complaints of testicle pain.  Patient report for the past 2 days he has noticed pain primarily to his left testicle.  Pain is more noticeable when he sits sounds and when she walks around.  Pain is nonradiating and there are no associated fever or chills no abdominal pain no back pain no hematuria dysuria penile discharge or having a rash.  He does endorse some unprotected sex recently.  Exam notable for tenderness to left testicle on palpation but no concerning rash no significant swelling and no hernia noted.  -Labs ordered, independently viewed and interpreted by me.  Labs remarkable for reassuring UA, labs are reassuring -The patient was maintained on a cardiac monitor.  I personally viewed and interpreted the cardiac monitored which showed an underlying rhythm of: NSR -Imaging independently viewed and interpreted by me and I agree with radiologist's interpretation.  Result remarkable for scrotal US without acute finding, no evidence of testicular torsion -This patient presents to the ED for concern of testicular pain, this involves an extensive number of treatment options, and is a complaint that carries with it a high risk of complications and morbidity.  The differential diagnosis includes orchitis, epididymitis, testicular  torsion, hernia, abscess, cellulitis, cervicitis -Co morbidities that complicate the patient evaluation includes prior sti -Treatment includes rocephin 500mg  IM once -Reevaluation of the patient after these medicines showed that the patient stayed the same -PCP office notes or outside notes reviewed -Escalation to admission/observation considered: patients feels much better, is comfortable with discharge, and will follow up with PCP -Prescription medication considered, patient comfortable with doxycycline -Social Determinant of Health considered which includes tobacco use  Other workup today without any acute finding patient is sexually active not using protection is having left testicle pain, will preemptively treat for possible early epididymitis with Rocephin IM and doxycycline.  Return precaution given.         Final Clinical Impression(s) / ED Diagnoses Final diagnoses:  Left testicular pain    Rx / DC Orders ED Discharge Orders  Ordered    doxycycline (VIBRAMYCIN) 100 MG capsule  2 times daily        05/27/23 1750    ibuprofen (ADVIL) 600 MG tablet  Every 6 hours PRN        05/27/23 1750              Fayrene Helper, PA-C 05/27/23 1751    Durwin Glaze, MD 05/27/23 [phone]

## 2023-05-27 NOTE — Discharge Instructions (Signed)
You have been evaluated for your left testicle pain.  Fortunately ultrasound today did not show any evidence of testicular torsion or any obvious infection however due to your ongoing left testicle pain, please take antibiotic to cover for potential sexually transmitted infection that can cause discomfort.  Please return to the ER if your symptoms worsen or if you have other concern.

## 2023-05-27 NOTE — ED Triage Notes (Signed)
Patient has had pain in left testicle since Tuesday. No burning urination. Had unprotected sex Tuesday and Sunday.

## 2023-05-27 NOTE — ED Provider Triage Note (Signed)
Emergency Medicine Provider Triage Evaluation Note  Kenneth Tanner , a 37 y.o. male  was evaluated in triage.  Pt complains of left testicular pain.  Started this past Tuesday.  Denies abnormal penile discharge or bleeding.  Denies dysuria.  Did have unprotected sex Tuesday and Sunday.  Review of Systems  Positive: See above Negative: See above  Physical Exam  BP (!) 148/107   Pulse 82   Temp 97.8 F (36.6 C) (Oral)   Resp 19   Ht 6\' 1"  (1.854 m)   Wt 127 kg   SpO2 98%   BMI 36.94 kg/m  Gen:   Awake, no distress   Resp:  Normal effort  MSK:   Moves extremities without difficulty  Other:    Medical Decision Making  Medically screening exam initiated at 11:25 AM.  Appropriate orders placed.  Kenneth Tanner was informed that the remainder of the evaluation will be completed by another provider, this initial triage assessment does not replace that evaluation, and the importance of remaining in the ED until their evaluation is complete.  Work up started   Gareth Eagle, PA-C 05/27/23 1126

## 2023-05-28 LAB — GC/CHLAMYDIA PROBE AMP (~~LOC~~) NOT AT ARMC
Chlamydia: NEGATIVE
Comment: NEGATIVE
Comment: NORMAL
Neisseria Gonorrhea: NEGATIVE

## 2023-05-28 LAB — RPR: RPR Ser Ql: NONREACTIVE
